# Patient Record
Sex: Male | Born: 1965 | Race: White | Hispanic: No | Marital: Single | Smoking: Never smoker
Health system: Southern US, Community
[De-identification: ages and names within clinical notes are randomized; demographics above are authoritative.]

## PROBLEM LIST (undated history)

## (undated) DIAGNOSIS — R011 Cardiac murmur, unspecified: Secondary | ICD-10-CM

## (undated) DIAGNOSIS — T7840XA Allergy, unspecified, initial encounter: Secondary | ICD-10-CM

## (undated) DIAGNOSIS — I10 Essential (primary) hypertension: Secondary | ICD-10-CM

## (undated) DIAGNOSIS — E785 Hyperlipidemia, unspecified: Secondary | ICD-10-CM

## (undated) HISTORY — PX: OTHER SURGICAL HISTORY: SHX169

## (undated) HISTORY — DX: Cardiac murmur, unspecified: R01.1

## (undated) HISTORY — DX: Allergy, unspecified, initial encounter: T78.40XA

## (undated) HISTORY — DX: Essential (primary) hypertension: I10

## (undated) HISTORY — DX: Hyperlipidemia, unspecified: E78.5

---

## 2000-05-03 ENCOUNTER — Encounter: Payer: Self-pay | Admitting: Internal Medicine

## 2000-05-03 ENCOUNTER — Ambulatory Visit (HOSPITAL_COMMUNITY): Admission: RE | Admit: 2000-05-03 | Discharge: 2000-05-03 | Payer: Self-pay | Admitting: Internal Medicine

## 2000-05-03 ENCOUNTER — Emergency Department (HOSPITAL_COMMUNITY): Admission: EM | Admit: 2000-05-03 | Discharge: 2000-05-03 | Payer: Self-pay | Admitting: Emergency Medicine

## 2000-06-24 ENCOUNTER — Encounter: Admission: RE | Admit: 2000-06-24 | Discharge: 2000-09-22 | Payer: Self-pay | Admitting: Specialist

## 2000-08-25 ENCOUNTER — Encounter: Payer: Self-pay | Admitting: *Deleted

## 2000-08-25 ENCOUNTER — Encounter: Admission: RE | Admit: 2000-08-25 | Discharge: 2000-08-25 | Payer: Self-pay | Admitting: *Deleted

## 2003-08-29 ENCOUNTER — Encounter: Payer: Self-pay | Admitting: Internal Medicine

## 2005-01-27 ENCOUNTER — Ambulatory Visit (HOSPITAL_COMMUNITY): Admission: RE | Admit: 2005-01-27 | Discharge: 2005-01-27 | Payer: Self-pay | Admitting: Internal Medicine

## 2005-01-27 ENCOUNTER — Ambulatory Visit: Payer: Self-pay | Admitting: Internal Medicine

## 2009-07-16 ENCOUNTER — Ambulatory Visit: Payer: Self-pay | Admitting: Internal Medicine

## 2009-07-16 DIAGNOSIS — I10 Essential (primary) hypertension: Secondary | ICD-10-CM | POA: Insufficient documentation

## 2009-07-17 ENCOUNTER — Encounter: Payer: Self-pay | Admitting: Internal Medicine

## 2009-07-17 LAB — CONVERTED CEMR LAB
ALT: 21 units/L (ref 0–53)
AST: 20 units/L (ref 0–37)
Albumin: 4 g/dL (ref 3.5–5.2)
Alkaline Phosphatase: 69 units/L (ref 39–117)
BUN: 13 mg/dL (ref 6–23)
Basophils Absolute: 0 10*3/uL (ref 0.0–0.1)
Basophils Relative: 0.8 % (ref 0.0–3.0)
Bilirubin Urine: NEGATIVE
Bilirubin, Direct: 0.2 mg/dL (ref 0.0–0.3)
CO2: 32 meq/L (ref 19–32)
Calcium: 9.4 mg/dL (ref 8.4–10.5)
Chloride: 101 meq/L (ref 96–112)
Cholesterol: 197 mg/dL (ref 0–200)
Creatinine, Ser: 1.1 mg/dL (ref 0.4–1.5)
Direct LDL: 109.4 mg/dL
Eosinophils Absolute: 0.2 10*3/uL (ref 0.0–0.7)
Eosinophils Relative: 3.3 % (ref 0.0–5.0)
GFR calc non Af Amer: 77.58 mL/min (ref >60)
Glucose, Bld: 98 mg/dL (ref 70–99)
HCT: 47.6 % (ref 39.0–52.0)
HDL: 41.7 mg/dL (ref >39.00)
Hemoglobin, Urine: NEGATIVE
Hemoglobin: 16 g/dL (ref 13.0–17.0)
Ketones, ur: NEGATIVE mg/dL
Leukocytes, UA: NEGATIVE
Lymphocytes Relative: 39.5 % (ref 12.0–46.0)
Lymphs Abs: 1.9 10*3/uL (ref 0.7–4.0)
MCHC: 33.7 g/dL (ref 30.0–36.0)
MCV: 92.2 fL (ref 78.0–100.0)
Monocytes Absolute: 0.4 10*3/uL (ref 0.1–1.0)
Monocytes Relative: 7.7 % (ref 3.0–12.0)
Neutro Abs: 2.3 10*3/uL (ref 1.4–7.7)
Neutrophils Relative %: 48.7 % (ref 43.0–77.0)
Nitrite: NEGATIVE
PSA: 0.91 ng/mL (ref 0.10–4.00)
Platelets: 159 10*3/uL (ref 150.0–400.0)
Potassium: 4.5 meq/L (ref 3.5–5.1)
RBC: 5.16 M/uL (ref 4.22–5.81)
RDW: 11.6 % (ref 11.5–14.6)
Sodium: 139 meq/L (ref 135–145)
Specific Gravity, Urine: 1.025 (ref 1.000–1.030)
TSH: 1.44 microintl units/mL (ref 0.35–5.50)
Total Bilirubin: 0.8 mg/dL (ref 0.3–1.2)
Total CHOL/HDL Ratio: 5
Total Protein, Urine: NEGATIVE mg/dL
Total Protein: 7.5 g/dL (ref 6.0–8.3)
Triglycerides: 350 mg/dL — ABNORMAL HIGH (ref 0.0–149.0)
Urine Glucose: NEGATIVE mg/dL
Urobilinogen, UA: 0.2 (ref 0.0–1.0)
VLDL: 70 mg/dL — ABNORMAL HIGH (ref 0.0–40.0)
WBC: 4.8 10*3/uL (ref 4.5–10.5)
pH: 6 (ref 5.0–8.0)

## 2009-07-25 ENCOUNTER — Encounter: Admission: RE | Admit: 2009-07-25 | Discharge: 2009-07-25 | Payer: Self-pay | Admitting: Internal Medicine

## 2009-07-25 ENCOUNTER — Telehealth: Payer: Self-pay | Admitting: Internal Medicine

## 2009-08-16 ENCOUNTER — Ambulatory Visit: Payer: Self-pay | Admitting: Internal Medicine

## 2010-07-01 NOTE — Medication Information (Signed)
Summary: Losartan Approved/MedImpact  Losartan Approved/MedImpact   Imported By: Sherian Rein 07/26/2009 07:35:42  _____________________________________________________________________  External Attachment:    Type:   Image     Comment:   External Document
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
## 2010-07-01 NOTE — Progress Notes (Signed)
Summary: Losartan PA approved  Phone Note From Pharmacy   Caller: Med[employer] Summary of Call: PA for Losartan Potassium was done and it approved for 12 fills until 07/21/2010. Initial call taken by: Lucious Groves,  July 25, 2009 8:48 AM

## 2010-07-01 NOTE — Progress Notes (Signed)
Summary: BP readings/Patient  BP readings/Patient   Imported By: Sherian Rein 07/20/2009 09:54:31  _____________________________________________________________________  External Attachment:    Type:   Image     Comment:   External Document

## 2010-07-01 NOTE — Medication Information (Signed)
Summary: [employer]   Imported By: Lester [place] 07/25/2009 09:41:36  _____________________________________________________________________  External Attachment:    Type:   Image     Comment:   External Document

## 2010-08-06 ENCOUNTER — Telehealth: Payer: Self-pay | Admitting: Internal Medicine

## 2010-08-12 NOTE — Progress Notes (Signed)
  Phone Note Refill Request Message from:  Fax from Pharmacy on August 06, 2010 9:26 AM  Refills Requested: Medication #1:  LOSARTAN POTASSIUM 50 MG TABS 1 po  once daily.   Dosage confirmed as above?Dosage Confirmed   Last Refilled: 07/2009   Notes: [facility] [place] Initial call taken by: Robin Ewing CMA (AAMA),  August 06, 2010 9:27 AM    Prescriptions: LOSARTAN POTASSIUM 50 MG TABS (LOSARTAN POTASSIUM) 1 po  once daily  #90 x 0   Entered by:   Scharlene Gloss CMA (AAMA)   Authorized by:   Corwin Levins MD   Signed by:   Scharlene Gloss CMA (AAMA) on 08/06/2010   Method used:   [facility]ed to ...       [facility] [address] [facility] [address] (retail)       [address] [place], [place]  [address]       Ph: [phone]       Fax: [phone]   RxID:   [id number]

## 2010-08-15 ENCOUNTER — Other Ambulatory Visit: Payer: PRIVATE HEALTH INSURANCE

## 2010-08-15 ENCOUNTER — Encounter: Payer: Self-pay | Admitting: Internal Medicine

## 2010-08-15 ENCOUNTER — Other Ambulatory Visit: Payer: Self-pay | Admitting: Internal Medicine

## 2010-08-15 ENCOUNTER — Ambulatory Visit (INDEPENDENT_AMBULATORY_CARE_PROVIDER_SITE_OTHER): Payer: PRIVATE HEALTH INSURANCE | Admitting: Internal Medicine

## 2010-08-15 DIAGNOSIS — Z0000 Encounter for general adult medical examination without abnormal findings: Secondary | ICD-10-CM

## 2010-08-15 DIAGNOSIS — M79609 Pain in unspecified limb: Secondary | ICD-10-CM | POA: Insufficient documentation

## 2010-08-15 DIAGNOSIS — R21 Rash and other nonspecific skin eruption: Secondary | ICD-10-CM | POA: Insufficient documentation

## 2010-08-15 LAB — LIPID PANEL: Cholesterol: 181 mg/dL (ref 0–200)

## 2010-08-15 LAB — HEPATIC FUNCTION PANEL
ALT: 27 U/L (ref 0–53)
AST: 22 U/L (ref 0–37)
Albumin: 4.4 g/dL (ref 3.5–5.2)
Total Protein: 7.7 g/dL (ref 6.0–8.3)

## 2010-08-15 LAB — CBC WITH DIFFERENTIAL/PLATELET
Basophils Relative: 0.7 % (ref 0.0–3.0)
Eosinophils Relative: 3.4 % (ref 0.0–5.0)
HCT: 45.1 % (ref 39.0–52.0)
Hemoglobin: 15.8 g/dL (ref 13.0–17.0)
Lymphs Abs: 2.3 10*3/uL (ref 0.7–4.0)
Monocytes Relative: 8.6 % (ref 3.0–12.0)
Neutro Abs: 2.3 10*3/uL (ref 1.4–7.7)
RBC: 4.95 Mil/uL (ref 4.22–5.81)
RDW: 12.1 % (ref 11.5–14.6)
WBC: 5.3 10*3/uL (ref 4.5–10.5)

## 2010-08-15 LAB — BASIC METABOLIC PANEL
BUN: 14 mg/dL (ref 6–23)
CO2: 30 mEq/L (ref 19–32)
Calcium: 8.8 mg/dL (ref 8.4–10.5)
Chloride: 102 mEq/L (ref 96–112)
Creatinine, Ser: 0.9 mg/dL (ref 0.4–1.5)
GFR: 92.54 mL/min (ref >60.00)
Glucose, Bld: 97 mg/dL (ref 70–99)
Potassium: 4.7 mEq/L (ref 3.5–5.1)
Sodium: 137 mEq/L (ref 135–145)

## 2010-08-15 LAB — URINALYSIS
Hgb urine dipstick: NEGATIVE
Leukocytes, UA: NEGATIVE
Nitrite: NEGATIVE
Total Protein, Urine: NEGATIVE
pH: 7 (ref 5.0–8.0)

## 2010-08-18 DIAGNOSIS — E785 Hyperlipidemia, unspecified: Secondary | ICD-10-CM

## 2010-08-18 LAB — LDL CHOLESTEROL, DIRECT: Direct LDL: 93.5 mg/dL
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
## 2010-12-22 ENCOUNTER — Encounter: Payer: Self-pay | Admitting: Internal Medicine

## 2010-12-22 ENCOUNTER — Ambulatory Visit (INDEPENDENT_AMBULATORY_CARE_PROVIDER_SITE_OTHER): Payer: PRIVATE HEALTH INSURANCE | Admitting: Internal Medicine

## 2010-12-22 ENCOUNTER — Ambulatory Visit (INDEPENDENT_AMBULATORY_CARE_PROVIDER_SITE_OTHER)
Admission: RE | Admit: 2010-12-22 | Discharge: 2010-12-22 | Disposition: A | Discharge status: Home / Self Care | Payer: PRIVATE HEALTH INSURANCE | Source: Ambulatory Visit | Attending: Internal Medicine | Admitting: Internal Medicine

## 2010-12-22 VITALS — BP 104/62 | HR 46 | Temp 97.9°F | Ht 74.0 in | Wt 205.0 lb

## 2010-12-22 DIAGNOSIS — M25539 Pain in unspecified wrist: Secondary | ICD-10-CM

## 2010-12-22 DIAGNOSIS — I10 Essential (primary) hypertension: Secondary | ICD-10-CM

## 2010-12-22 DIAGNOSIS — M25532 Pain in left wrist: Secondary | ICD-10-CM

## 2010-12-22 DIAGNOSIS — S62109A Fracture of unspecified carpal bone, unspecified wrist, initial encounter for closed fracture: Secondary | ICD-10-CM | POA: Insufficient documentation

## 2010-12-22 DIAGNOSIS — Z0001 Encounter for general adult medical examination with abnormal findings: Secondary | ICD-10-CM | POA: Insufficient documentation

## 2010-12-22 DIAGNOSIS — Z0000 Encounter for general adult medical examination without abnormal findings: Secondary | ICD-10-CM | POA: Insufficient documentation

## 2010-12-22 NOTE — Patient Instructions (Addendum)
Take all new medications as prescribed Continue all other medications as before Please go to XRAY in the Basement for the x-ray test Please call the phone number [phone] (the PhoneTree System) for results of testing in 2-3 days;  When calling, simply dial the number, and when prompted enter the MRN number above (the Medical Record Number) and the # key, then the message should start. You will be referred to orthopedic if there is a fracture Please return in March 2013 with Lab testing done 3-5 days before

## 2010-12-22 NOTE — Assessment & Plan Note (Signed)
stable overall by hx and exam, most recent data reviewed with pt, and pt to continue medical treatment as before  BP Readings from Last 3 Encounters:  12/22/10 104/62  08/15/10 104/70  08/16/09 102/64
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
## 2010-12-22 NOTE — Assessment & Plan Note (Signed)
Mod to severe swelling and pain, with bruising s/p fall with tennis x 3 days - for film today, I suspect fx, may need ortho referral

## 2011-09-14 ENCOUNTER — Other Ambulatory Visit: Payer: Self-pay

## 2011-09-14 MED ORDER — LOSARTAN POTASSIUM 50 MG PO TABS: 50.0000 mg | ORAL_TABLET | Freq: Every day | ORAL | Status: DC

## 2011-11-06 ENCOUNTER — Encounter: Payer: Self-pay | Admitting: Internal Medicine

## 2011-11-06 ENCOUNTER — Other Ambulatory Visit (INDEPENDENT_AMBULATORY_CARE_PROVIDER_SITE_OTHER): Payer: PRIVATE HEALTH INSURANCE

## 2011-11-06 ENCOUNTER — Telehealth: Payer: Self-pay

## 2011-11-06 ENCOUNTER — Ambulatory Visit (INDEPENDENT_AMBULATORY_CARE_PROVIDER_SITE_OTHER): Payer: PRIVATE HEALTH INSURANCE | Admitting: Internal Medicine

## 2011-11-06 VITALS — BP 102/62 | HR 45 | Temp 97.5°F | Ht 74.0 in | Wt 204.2 lb

## 2011-11-06 DIAGNOSIS — R21 Rash and other nonspecific skin eruption: Secondary | ICD-10-CM

## 2011-11-06 DIAGNOSIS — Z0000 Encounter for general adult medical examination without abnormal findings: Secondary | ICD-10-CM

## 2011-11-06 DIAGNOSIS — A64 Unspecified sexually transmitted disease: Secondary | ICD-10-CM

## 2011-11-06 LAB — TSH: TSH: 1.78 u[IU]/mL (ref 0.35–5.50)

## 2011-11-06 LAB — URINALYSIS, ROUTINE W REFLEX MICROSCOPIC
Bilirubin Urine: NEGATIVE
Hgb urine dipstick: NEGATIVE
Ketones, ur: NEGATIVE
Leukocytes, UA: NEGATIVE
Urobilinogen, UA: 0.2 (ref 0.0–1.0)

## 2011-11-06 LAB — BASIC METABOLIC PANEL
BUN: 15 mg/dL (ref 6–23)
Chloride: 105 mEq/L (ref 96–112)
GFR: 94.35 mL/min (ref >60.00)
Glucose, Bld: 86 mg/dL (ref 70–99)
Potassium: 4.6 mEq/L (ref 3.5–5.1)
Sodium: 139 mEq/L (ref 135–145)

## 2011-11-06 LAB — CBC WITH DIFFERENTIAL/PLATELET
Basophils Absolute: 0.1 10*3/uL (ref 0.0–0.1)
HCT: 44.4 % (ref 39.0–52.0)
Lymphs Abs: 1.5 10*3/uL (ref 0.7–4.0)
MCV: 91.9 fl (ref 78.0–100.0)
Monocytes Absolute: 0.3 10*3/uL (ref 0.1–1.0)
Monocytes Relative: 7.2 % (ref 3.0–12.0)
Neutrophils Relative %: 52.6 % (ref 43.0–77.0)
Platelets: 139 10*3/uL — ABNORMAL LOW (ref 150.0–400.0)
RDW: 12.8 % (ref 11.5–14.6)

## 2011-11-06 LAB — LIPID PANEL
Cholesterol: 162 mg/dL (ref 0–200)
VLDL: 34 mg/dL (ref 0.0–40.0)

## 2011-11-06 LAB — HEPATIC FUNCTION PANEL
ALT: 20 U/L (ref 0–53)
AST: 18 U/L (ref 0–37)
Albumin: 4 g/dL (ref 3.5–5.2)
Alkaline Phosphatase: 61 U/L (ref 39–117)

## 2011-11-06 MED ORDER — ASPIRIN 81 MG PO TBEC: 81.0000 mg | DELAYED_RELEASE_TABLET | Freq: Every day | ORAL | Status: AC

## 2011-11-06 MED ORDER — LOSARTAN POTASSIUM 50 MG PO TABS: 50.0000 mg | ORAL_TABLET | Freq: Every day | ORAL | Status: DC

## 2011-11-06 MED ORDER — IMIQUIMOD 5 % EX CREA: TOPICAL_CREAM | CUTANEOUS | Status: DC

## 2011-11-06 NOTE — Telephone Encounter (Signed)
There is no good alternative, ok for PA, if does not work out he will need referral to derm

## 2011-11-06 NOTE — Patient Instructions (Signed)
Take all new medications as prescribed Continue all other medications as before Please go to LAB in the Basement for the blood and/or urine tests to be done today You will be contacted by phone if any changes need to be made immediately.  Otherwise, you will receive a letter about your results with an explanation. Please start Aspirin 82 mg - 1 per day - COATED only (OTC) - to reduce risk of stroke and heart disease Please return in 1 year for your yearly visit, or sooner if needed, with Lab testing done 3-5 days before

## 2011-11-06 NOTE — Telephone Encounter (Signed)
Received PA for Imiquimod 5% cream packet offer alternative or proceed with PA

## 2011-11-07 ENCOUNTER — Encounter: Payer: Self-pay | Admitting: Internal Medicine

## 2011-11-07 LAB — HIV ANTIBODY (ROUTINE TESTING W REFLEX): HIV: NONREACTIVE

## 2011-11-07 LAB — GC/CHLAMYDIA PROBE AMP, URINE
Chlamydia, Swab/Urine, PCR: NEGATIVE
GC Probe Amp, Urine: NEGATIVE

## 2011-11-07 LAB — RPR
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
## 2011-11-07 NOTE — Assessment & Plan Note (Signed)

## 2011-11-07 NOTE — Assessment & Plan Note (Signed)
Ok for trial imiquomod asd, for STD labs, refer to derm if lesions not improved

## 2011-11-09 ENCOUNTER — Encounter: Payer: Self-pay | Admitting: Internal Medicine

## 2011-11-09 LAB — HEPATITIS PANEL, ACUTE
HCV Ab: NEGATIVE
Hepatitis B Surface Ag: NEGATIVE

## 2011-11-09 LAB — HSV 2 ANTIBODY, IGG: HSV 2 Glycoprotein G Ab, IgG: 0.13 IV

## 2011-11-12 NOTE — Telephone Encounter (Signed)
Faxed PA completed form to [phone] awaiting response.

## 2011-11-16 NOTE — Telephone Encounter (Signed)
Please inform pt , insurance will not pay for the tx we gave  I think he should still f/u with dermatology as we have referred

## 2011-11-16 NOTE — Telephone Encounter (Signed)
Received denial for PA for this medication.  Approval requires a trial of or a contraindication to podofilox. Please advise

## 2011-11-17 NOTE — Telephone Encounter (Signed)
Done per emr 

## 2011-11-17 NOTE — Telephone Encounter (Signed)
Called the patient informed PA denied.  Patient agreed to derm referral asap.

## 2012-06-06 ENCOUNTER — Ambulatory Visit (INDEPENDENT_AMBULATORY_CARE_PROVIDER_SITE_OTHER): Payer: PRIVATE HEALTH INSURANCE | Admitting: Internal Medicine

## 2012-06-06 ENCOUNTER — Encounter: Payer: Self-pay | Admitting: Internal Medicine

## 2012-06-06 VITALS — BP 132/86 | HR 47 | Temp 98.0°F

## 2012-06-06 DIAGNOSIS — H00019 Hordeolum externum unspecified eye, unspecified eyelid: Secondary | ICD-10-CM

## 2012-06-06 DIAGNOSIS — Z23 Encounter for immunization: Secondary | ICD-10-CM

## 2012-06-06 MED ORDER — ERYTHROMYCIN 5 MG/GM OP OINT: TOPICAL_OINTMENT | Freq: Every day | OPHTHALMIC | Status: DC

## 2012-06-06 NOTE — Progress Notes (Signed)
Subjective:    Patient ID: George Molina, male    DOB: 04-29-1966, 47 y.o.   MRN: [id number]  HPI  Pt presents to the clinic today with c/o a bump on his eyelid. It has been there for 1 week. It has not drained. It is tender. He has never had this before. He has not put anything on it. He denies fevers.  Review of Systems  Past Medical History  Diagnosis Date  . Hypertension     Current Outpatient Prescriptions  Medication Sig Dispense Refill  . aspirin 81 MG EC tablet Take 1 tablet (81 mg total) by mouth daily. Swallow whole.  30 tablet  12  . fluocinolone (SYNALAR) 0.025 % cream Apply topically daily as needed.        . imiquimod (ALDARA) 5 % cream Apply topically 3 (three) times a week.  12 each  0  . losartan (COZAAR) 50 MG tablet Take 1 tablet (50 mg total) by mouth daily.  90 tablet  3    No Known Allergies  Family History  Problem Relation Age of Onset  . Hypertension Mother   . Hypertension Brother   . Cancer Maternal Uncle     stomach cancer  . Cancer Maternal Grandfather     stomach cancer    History   Social History  . Marital Status: Married    Spouse Name: N/A    Number of Children: N/A  . Years of Education: N/A   Occupational History  . 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    Social History Main Topics  . Smoking status: Never Smoker   . Smokeless tobacco: Not on file  . Alcohol Use: Yes     Comment: rare  . Drug Use: No  . Sexually Active: Not on file   Other Topics Concern  . Not on file   Social History Narrative   [place] descent     Constitutional: Denies fever, malaise, fatigue, headache or abrupt weight changes.  HEENT: Denies eye pain, eye redness, ear pain, ringing in the ears, wax buildup, runny nose, nasal congestion, bloody nose, or sore throat. Skin: Pt reports a bump on his eyelid. Denies redness, rashes, lesions or ulcercations.  Neurological: Denies dizziness, difficulty with memory, difficulty with speech or  problems with balance and coordination.   No other specific complaints in a complete review of systems (except as listed in HPI above).     Objective:   Physical Exam   BP 132/86  Pulse 47  Temp 98 F (36.7 C) (Oral)  SpO2 97% Wt Readings from Last 3 Encounters:  11/06/11 204 lb 4 oz (92.647 kg)  12/22/10 205 lb (92.987 kg)  08/15/10 209 lb 2.1 oz (94.861 kg)    General: Appears his stated age, well developed, well nourished in NAD. Skin: Sty on right eye. Warm, dry and intact. No rashes, lesions or ulcerations noted. HEENT: Head: normal shape and size; Eyes: sclera white, no icterus, conjunctiva pink, PERRLA and EOMs intact; Ears: Tm's gray and intact, normal light reflex; Nose: mucosa pink and moist, septum midline; Throat/Mouth: Teeth present, mucosa pink and moist, no exudate, lesions or ulcerations noted.  Cardiovascular: Normal rate and rhythm. S1,S2 noted.  No murmur, rubs or gallops noted. No JVD or BLE edema. No carotid bruits noted. Pulmonary/Chest: Normal effort and positive vesicular breath sounds. No respiratory distress. No wheezes, rales or ronchi noted.       Assessment & Plan:   Sty, new onset with  additional workup required:  Warm compresses to the area 4 x per day Erythromycin eye ointment  RTC as needed or if symptoms persist

## 2012-06-06 NOTE — Patient Instructions (Addendum)
Sty  A sty (hordeolum) is an infection of a gland in the eyelid located at the base of the eyelash. A sty may develop a white or yellow head of pus. It can be puffy (swollen). Usually, the sty will burst and pus will come out on its own. They do not leave lumps in the eyelid once they drain.  A sty is often confused with another form of cyst of the eyelid called a chalazion. Chalazions occur within the eyelid and not on the edge where the bases of the eyelashes are. They often are red, sore and then form firm lumps in the eyelid.  CAUSES    Germs (bacteria).   Lasting (chronic) eyelid inflammation.  SYMPTOMS    Tenderness, redness and swelling along the edge of the eyelid at the base of the eyelashes.   Sometimes, there is a white or yellow head of pus. It may or may not drain.  DIAGNOSIS   An ophthalmologist will be able to distinguish between a sty and a chalazion and treat the condition appropriately.   TREATMENT    Styes are typically treated with warm packs (compresses) until drainage occurs.   In rare cases, medicines that kill germs (antibiotics) may be prescribed. These antibiotics may be in the form of drops, cream or pills.   If a hard lump has formed, it is generally necessary to do a small incision and remove the hardened contents of the cyst in a minor surgical procedure done in the office.   In suspicious cases, your caregiver may send the contents of the cyst to the lab to be certain that it is not a rare, but dangerous form of cancer of the glands of the eyelid.  HOME CARE INSTRUCTIONS    Wash your hands often and dry them with a clean towel. Avoid touching your eyelid. This may spread the infection to other parts of the eye.   Apply heat to your eyelid for 10 to 20 minutes, several times a day, to ease pain and help to heal it faster.   Do not squeeze the sty. Allow it to drain on its own. Wash your eyelid carefully 3 to 4 times per day to remove any pus.  SEEK IMMEDIATE MEDICAL CARE IF:     Your eye becomes painful or puffy (swollen).   Your vision changes.   Your sty does not drain by itself within 3 days.   Your sty comes back within a short period of time, even with treatment.   You have redness (inflammation) around the eye.   You have a fever.  Document Released: 02/25/2005 Document Revised: 08/10/2011 Document Reviewed: 10/30/2008  [employer] Patient Information 2013 [employer].

## 2012-12-08 ENCOUNTER — Other Ambulatory Visit: Payer: Self-pay | Admitting: Internal Medicine

## 2013-05-08 ENCOUNTER — Ambulatory Visit: Payer: PRIVATE HEALTH INSURANCE

## 2013-05-08 ENCOUNTER — Ambulatory Visit (INDEPENDENT_AMBULATORY_CARE_PROVIDER_SITE_OTHER): Payer: PRIVATE HEALTH INSURANCE | Admitting: Family Medicine

## 2013-05-08 VITALS — BP 138/72 | HR 49 | Temp 98.3°F | Resp 16 | Ht 74.5 in | Wt 207.0 lb

## 2013-05-08 DIAGNOSIS — S6991XA Unspecified injury of right wrist, hand and finger(s), initial encounter: Secondary | ICD-10-CM

## 2013-05-08 DIAGNOSIS — M79641 Pain in right hand: Secondary | ICD-10-CM

## 2013-05-08 DIAGNOSIS — M25441 Effusion, right hand: Secondary | ICD-10-CM

## 2013-05-08 DIAGNOSIS — M25449 Effusion, unspecified hand: Secondary | ICD-10-CM

## 2013-05-08 DIAGNOSIS — S6990XA Unspecified injury of unspecified wrist, hand and finger(s), initial encounter: Secondary | ICD-10-CM

## 2013-05-08 DIAGNOSIS — M79609 Pain in unspecified limb: Secondary | ICD-10-CM
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
## 2013-05-08 NOTE — Patient Instructions (Signed)
Your xrays do not show a fracture today, but I want to make sure we protect this area.  Wear the splint for the next 4 days.  Recheck with [place] on Thursday 12/11.  Continue icing and using ibuprofen for pain relief.  If any symptoms worsen or if you have concerns, please come back in sooner.     Hand Contusion A hand contusion is a deep bruise on your hand area. Contusions are the result of an injury that caused bleeding under the skin. The contusion may turn blue, purple, or yellow. Minor injuries will give you a painless contusion, but more severe contusions may stay painful and swollen for a few weeks. CAUSES  A contusion is usually caused by a blow, trauma, or direct force to an area of the body. SYMPTOMS   Swelling and redness of the injured area.  Discoloration of the injured area.  Tenderness and soreness of the injured area.  Pain. DIAGNOSIS  The diagnosis can be made by taking a history and performing a physical exam. An X-ray, CT scan, or MRI may be needed to determine if there were any associated injuries, such as broken bones (fractures). TREATMENT  Often, the best treatment for a hand contusion is resting, elevating, icing, and applying cold compresses to the injured area. Over-the-counter medicines may also be recommended for pain control. HOME CARE INSTRUCTIONS   Put ice on the injured area.  Put ice in a plastic bag.  Place a towel between your skin and the bag.  Leave the ice on for 15-20 minutes, 03-04 times a day.  Only take over-the-counter or prescription medicines as directed by your caregiver. Your caregiver may recommend avoiding anti-inflammatory medicines (aspirin, ibuprofen, and naproxen) for 48 hours because these medicines may increase bruising.  If told, use an elastic wrap as directed. This can help reduce swelling. You may remove the wrap for sleeping, showering, and bathing. If your fingers become numb, cold, or blue, take the wrap off and reapply it  more loosely.  Elevate your hand with pillows to reduce swelling.  Avoid overusing your hand if it is painful. SEEK IMMEDIATE MEDICAL CARE IF:   You have increased redness, swelling, or pain in your hand.  Your swelling or pain is not relieved with medicines.  You have loss of feeling in your hand or are unable to move your fingers.  Your hand turns cold or blue.  You have pain when you move your fingers.  Your hand becomes warm to the touch. MAKE SURE YOU:   Understand these instructions.  Will watch your condition.  Will get help right away if you are not doing well or get worse. Document Released: 11/07/2001 Document Revised: 02/10/2012 Document Reviewed: 11/09/2011 [facility] Patient Information 2014 [place], [place].

## 2013-05-11 ENCOUNTER — Ambulatory Visit (INDEPENDENT_AMBULATORY_CARE_PROVIDER_SITE_OTHER): Payer: PRIVATE HEALTH INSURANCE | Admitting: Internal Medicine

## 2013-05-11 VITALS — BP 122/70 | HR 51 | Temp 98.2°F | Resp 16 | Ht 72.5 in | Wt 210.0 lb

## 2013-05-11 DIAGNOSIS — S60221A Contusion of right hand, initial encounter: Secondary | ICD-10-CM

## 2013-05-11 DIAGNOSIS — S60229A Contusion of unspecified hand, initial encounter: Secondary | ICD-10-CM

## 2013-05-11 DIAGNOSIS — M79641 Pain in right hand: Secondary | ICD-10-CM

## 2013-05-11 DIAGNOSIS — M79609 Pain in unspecified limb: Secondary | ICD-10-CM

## 2013-05-11 NOTE — Progress Notes (Signed)
   Subjective:    Patient ID: George Molina, male    DOB: 03/01/1966, 47 y.o.   MRN: [id number]  HPI Patient is here for a follow up visit about his right hand. Ms Dollene Cleveland thought it could've been a FX that she couldn't see so wanted him to come back in to see how hand is doing. Patient states that its much better no swelling pain is minimal can now make a tight fist without it hurting. Patient states that it do hurt some if he squeezes something with that hand. This will be a week old injury Saturday. The radiologist didn't see a FX in his hand. Still have some small bruising on top of his hand     Review of Systems  Constitutional: Negative for activity change.  Skin: Negative for color change.  Neurological: Negative for weakness.       Objective:   Physical Exam  Constitutional: He is oriented to person, place, and time. He appears well-developed.  HENT:  Head: Normocephalic.  Eyes: EOM are normal.  Pulmonary/Chest: Effort normal.  Musculoskeletal: Normal range of motion. He exhibits edema and tenderness.  Neurological: He is alert and oriented to person, place, and time. No cranial nerve deficit. He exhibits normal muscle tone. Coordination normal.  Psychiatric: He has a normal mood and affect. His behavior is normal. Thought content normal.   Right hand swollen but healing, eccymosis resolving. NMVS intat, full rom       Assessment & Plan:  Rehab taught

## 2013-05-11 NOTE — Patient Instructions (Signed)
Hand Contusion °A hand contusion is a deep bruise on your hand area. Contusions are the result of an injury that caused bleeding under the skin. The contusion may turn blue, purple, or yellow. Minor injuries will give you a painless contusion, but more severe contusions may stay painful and swollen for a few weeks. °CAUSES  °A contusion is usually caused by a blow, trauma, or direct force to an area of the body. °SYMPTOMS  °· Swelling and redness of the injured area. °· Discoloration of the injured area. °· Tenderness and soreness of the injured area. °· Pain. °DIAGNOSIS  °The diagnosis can be made by taking a history and performing a physical exam. An X-ray, CT scan, or MRI may be needed to determine if there were any associated injuries, such as broken bones (fractures). °TREATMENT  °Often, the best treatment for a hand contusion is resting, elevating, icing, and applying cold compresses to the injured area. Over-the-counter medicines may also be recommended for pain control. °HOME CARE INSTRUCTIONS  °· Put ice on the injured area. °· Put ice in a plastic bag. °· Place a towel between your skin and the bag. °· Leave the ice on for 15-20 minutes, 03-04 times a day. °· Only take over-the-counter or prescription medicines as directed by your caregiver. Your caregiver may recommend avoiding anti-inflammatory medicines (aspirin, ibuprofen, and naproxen) for 48 hours because these medicines may increase bruising. °· If told, use an elastic wrap as directed. This can help reduce swelling. You may remove the wrap for sleeping, showering, and bathing. If your fingers become numb, cold, or blue, take the wrap off and reapply it more loosely. °· Elevate your hand with pillows to reduce swelling. °· Avoid overusing your hand if it is painful. °SEEK IMMEDIATE MEDICAL CARE IF:  °· You have increased redness, swelling, or pain in your hand. °· Your swelling or pain is not relieved with medicines. °· You have loss of feeling in  your hand or are unable to move your fingers. °· Your hand turns cold or blue. °· You have pain when you move your fingers. °· Your hand becomes warm to the touch. °· Your contusion does not improve in 2 days. °MAKE SURE YOU:  °· Understand these instructions. °· Will watch your condition. °· Will get help right away if you are not doing well or get worse. °Document Released: 11/07/2001 Document Revised: 02/10/2012 Document Reviewed: 11/09/2011 °[employer]® Patient Information ©2014 [employer]. ° °

## 2013-06-10 ENCOUNTER — Other Ambulatory Visit: Payer: Self-pay | Admitting: Internal Medicine

## 2013-08-28 ENCOUNTER — Other Ambulatory Visit: Payer: Self-pay | Admitting: Internal Medicine

## 2014-03-15 ENCOUNTER — Encounter: Payer: Self-pay | Admitting: Internal Medicine

## 2014-05-31 ENCOUNTER — Ambulatory Visit (INDEPENDENT_AMBULATORY_CARE_PROVIDER_SITE_OTHER): Payer: 59 | Admitting: Internal Medicine

## 2014-05-31 ENCOUNTER — Other Ambulatory Visit (INDEPENDENT_AMBULATORY_CARE_PROVIDER_SITE_OTHER): Payer: 59

## 2014-05-31 ENCOUNTER — Encounter: Payer: Self-pay | Admitting: Internal Medicine

## 2014-05-31 VITALS — BP 122/82 | HR 47 | Temp 98.0°F | Ht 74.0 in | Wt 216.4 lb

## 2014-05-31 DIAGNOSIS — Z0000 Encounter for general adult medical examination without abnormal findings: Secondary | ICD-10-CM

## 2014-05-31 DIAGNOSIS — I10 Essential (primary) hypertension: Secondary | ICD-10-CM

## 2014-05-31 LAB — URINALYSIS, ROUTINE W REFLEX MICROSCOPIC
BILIRUBIN URINE: NEGATIVE
HGB URINE DIPSTICK: NEGATIVE
Ketones, ur: NEGATIVE
Leukocytes, UA: NEGATIVE
NITRITE: NEGATIVE
RBC / HPF: NONE SEEN (ref 0–?)
Specific Gravity, Urine: 1.025 (ref 1.000–1.030)
Total Protein, Urine: NEGATIVE
Urine Glucose: NEGATIVE
Urobilinogen, UA: 0.2 (ref 0.0–1.0)
WBC, UA: NONE SEEN (ref 0–?)
pH: 6 (ref 5.0–8.0)

## 2014-05-31 LAB — LIPID PANEL
CHOL/HDL RATIO: 5
Cholesterol: 207 mg/dL — ABNORMAL HIGH (ref 0–200)
HDL: 41.1 mg/dL (ref >39.00)
NonHDL: 165.9
TRIGLYCERIDES: 224 mg/dL — AB (ref 0.0–149.0)
VLDL: 44.8 mg/dL — ABNORMAL HIGH (ref 0.0–40.0)

## 2014-05-31 LAB — HEPATIC FUNCTION PANEL
ALK PHOS: 62 U/L (ref 39–117)
ALT: 41 U/L (ref 0–53)
AST: 33 U/L (ref 0–37)
Albumin: 4.1 g/dL (ref 3.5–5.2)
Bilirubin, Direct: 0.2 mg/dL (ref 0.0–0.3)
TOTAL PROTEIN: 7.5 g/dL (ref 6.0–8.3)
Total Bilirubin: 1.6 mg/dL — ABNORMAL HIGH (ref 0.2–1.2)

## 2014-05-31 LAB — CBC WITH DIFFERENTIAL/PLATELET
BASOS ABS: 0.1 10*3/uL (ref 0.0–0.1)
Basophils Relative: 1.2 % (ref 0.0–3.0)
EOS ABS: 0.2 10*3/uL (ref 0.0–0.7)
Eosinophils Relative: 3.8 % (ref 0.0–5.0)
HCT: 46.8 % (ref 39.0–52.0)
Hemoglobin: 15.9 g/dL (ref 13.0–17.0)
LYMPHS PCT: 38.8 % (ref 12.0–46.0)
Lymphs Abs: 2.3 10*3/uL (ref 0.7–4.0)
MCHC: 34 g/dL (ref 30.0–36.0)
MCV: 90.1 fl (ref 78.0–100.0)
MONOS PCT: 8.5 % (ref 3.0–12.0)
Monocytes Absolute: 0.5 10*3/uL (ref 0.1–1.0)
NEUTROS ABS: 2.8 10*3/uL (ref 1.4–7.7)
NEUTROS PCT: 47.7 % (ref 43.0–77.0)
Platelets: 161 10*3/uL (ref 150.0–400.0)
RBC: 5.2 Mil/uL (ref 4.22–5.81)
RDW: 12.4 % (ref 11.5–15.5)
WBC: 5.8 10*3/uL (ref 4.0–10.5)

## 2014-05-31 LAB — BASIC METABOLIC PANEL
BUN: 17 mg/dL (ref 6–23)
CALCIUM: 8.9 mg/dL (ref 8.4–10.5)
CHLORIDE: 104 meq/L (ref 96–112)
CO2: 30 meq/L (ref 19–32)
Creatinine, Ser: 1.1 mg/dL (ref 0.4–1.5)
GFR: 78.38 mL/min (ref >60.00)
Glucose, Bld: 92 mg/dL (ref 70–99)
POTASSIUM: 4.4 meq/L (ref 3.5–5.1)
Sodium: 136 mEq/L (ref 135–145)

## 2014-05-31 LAB — TSH: TSH: 2.72 u[IU]/mL (ref 0.35–4.50)

## 2014-05-31 LAB — PSA: PSA: 1.13 ng/mL (ref 0.10–4.00)

## 2014-05-31 MED ORDER — LOSARTAN POTASSIUM 50 MG PO TABS: 50.0000 mg | ORAL_TABLET | Freq: Every day | ORAL | Status: DC
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
## 2014-05-31 NOTE — Progress Notes (Signed)
Pre visit review using our clinic review tool, if applicable. No additional management support is needed unless otherwise documented below in the visit note. 

## 2014-05-31 NOTE — Patient Instructions (Signed)
Please continue all other medications as before, and refills have been done if requested - the losartan  Please continue to monitor your Blood Pressures at home, with the goal being an average less than 140/90  Please have the pharmacy call with any other refills you may need.  Please continue your efforts at being more active, low cholesterol diet, and weight control.  You are otherwise up to date with prevention measures today.  Please keep your appointments with your specialists as you may have planned  Please go to the LAB in the Basement (turn left off the elevator) for the tests to be done today  You will be contacted by phone if any changes need to be made immediately.  Otherwise, you will receive a letter about your results with an explanation, but please check with MyChart first.  Please remember to sign up for MyChart if you have not done so, as this will be important to you in the future with finding out test results, communicating by private email, and scheduling acute appointments online when needed.  Please return in 1 year for your yearly visit, or sooner if needed, with Lab testing done 3-5 days before

## 2014-06-04 LAB — LDL CHOLESTEROL, DIRECT: LDL DIRECT: 121.5 mg/dL

## 2014-06-05 NOTE — Assessment & Plan Note (Signed)

## 2014-06-05 NOTE — Assessment & Plan Note (Signed)
stable overall by history and exam, recent data reviewed with pt, and pt to continue medical treatment as before,  to f/u any worsening symptoms or concerns, for losartan 50 refill

## 2015-06-04 ENCOUNTER — Encounter: Payer: 59 | Admitting: Internal Medicine

## 2015-12-10 ENCOUNTER — Encounter: Payer: Self-pay | Admitting: Internal Medicine

## 2015-12-31 ENCOUNTER — Ambulatory Visit (INDEPENDENT_AMBULATORY_CARE_PROVIDER_SITE_OTHER): Payer: PRIVATE HEALTH INSURANCE | Admitting: Internal Medicine

## 2015-12-31 ENCOUNTER — Other Ambulatory Visit (INDEPENDENT_AMBULATORY_CARE_PROVIDER_SITE_OTHER): Payer: PRIVATE HEALTH INSURANCE

## 2015-12-31 ENCOUNTER — Encounter: Payer: Self-pay | Admitting: Internal Medicine

## 2015-12-31 VITALS — BP 140/84 | HR 50 | Temp 98.4°F | Resp 20 | Wt 218.0 lb

## 2015-12-31 DIAGNOSIS — Z0001 Encounter for general adult medical examination with abnormal findings: Secondary | ICD-10-CM

## 2015-12-31 DIAGNOSIS — L989 Disorder of the skin and subcutaneous tissue, unspecified: Secondary | ICD-10-CM | POA: Diagnosis not present

## 2015-12-31 DIAGNOSIS — E041 Nontoxic single thyroid nodule: Secondary | ICD-10-CM

## 2015-12-31 DIAGNOSIS — Z23 Encounter for immunization: Secondary | ICD-10-CM

## 2015-12-31 DIAGNOSIS — R6889 Other general symptoms and signs: Secondary | ICD-10-CM

## 2015-12-31 DIAGNOSIS — E785 Hyperlipidemia, unspecified: Secondary | ICD-10-CM | POA: Diagnosis not present

## 2015-12-31 DIAGNOSIS — I10 Essential (primary) hypertension: Secondary | ICD-10-CM

## 2015-12-31 DIAGNOSIS — Z0000 Encounter for general adult medical examination without abnormal findings: Secondary | ICD-10-CM

## 2015-12-31 HISTORY — DX: Hyperlipidemia, unspecified: E78.5

## 2015-12-31 LAB — LIPID PANEL
CHOL/HDL RATIO: 4
Cholesterol: 170 mg/dL (ref 0–200)
HDL: 42.4 mg/dL (ref >39.00)
LDL Cholesterol: 100 mg/dL — ABNORMAL HIGH (ref 0–99)
NONHDL: 127.38
Triglycerides: 139 mg/dL (ref 0.0–149.0)
VLDL: 27.8 mg/dL (ref 0.0–40.0)

## 2015-12-31 LAB — BASIC METABOLIC PANEL
BUN: 15 mg/dL (ref 6–23)
CHLORIDE: 103 meq/L (ref 96–112)
CO2: 30 meq/L (ref 19–32)
Calcium: 9.1 mg/dL (ref 8.4–10.5)
Creatinine, Ser: 1.03 mg/dL (ref 0.40–1.50)
GFR: 81.36 mL/min (ref >60.00)
Glucose, Bld: 91 mg/dL (ref 70–99)
POTASSIUM: 4.6 meq/L (ref 3.5–5.1)
SODIUM: 138 meq/L (ref 135–145)

## 2015-12-31 LAB — URINALYSIS, ROUTINE W REFLEX MICROSCOPIC
BILIRUBIN URINE: NEGATIVE
Hgb urine dipstick: NEGATIVE
KETONES UR: NEGATIVE
Leukocytes, UA: NEGATIVE
Nitrite: NEGATIVE
PH: 7.5 (ref 5.0–8.0)
RBC / HPF: NONE SEEN (ref 0–?)
SPECIFIC GRAVITY, URINE: 1.015 (ref 1.000–1.030)
TOTAL PROTEIN, URINE-UPE24: NEGATIVE
UROBILINOGEN UA: 0.2 (ref 0.0–1.0)
Urine Glucose: NEGATIVE

## 2015-12-31 LAB — CBC WITH DIFFERENTIAL/PLATELET
BASOS ABS: 0 10*3/uL (ref 0.0–0.1)
BASOS PCT: 0.6 % (ref 0.0–3.0)
EOS ABS: 0.1 10*3/uL (ref 0.0–0.7)
Eosinophils Relative: 2.4 % (ref 0.0–5.0)
HCT: 45.3 % (ref 39.0–52.0)
Hemoglobin: 15.6 g/dL (ref 13.0–17.0)
LYMPHS ABS: 1.8 10*3/uL (ref 0.7–4.0)
Lymphocytes Relative: 29.4 % (ref 12.0–46.0)
MCHC: 34.3 g/dL (ref 30.0–36.0)
MCV: 88.8 fl (ref 78.0–100.0)
Monocytes Absolute: 0.4 10*3/uL (ref 0.1–1.0)
Monocytes Relative: 5.9 % (ref 3.0–12.0)
NEUTROS ABS: 3.8 10*3/uL (ref 1.4–7.7)
NEUTROS PCT: 61.7 % (ref 43.0–77.0)
PLATELETS: 161 10*3/uL (ref 150.0–400.0)
RBC: 5.11 Mil/uL (ref 4.22–5.81)
RDW: 12.4 % (ref 11.5–15.5)
WBC: 6.2 10*3/uL (ref 4.0–10.5)

## 2015-12-31 LAB — HEPATIC FUNCTION PANEL
ALK PHOS: 68 U/L (ref 39–117)
ALT: 23 U/L (ref 0–53)
AST: 16 U/L (ref 0–37)
Albumin: 4 g/dL (ref 3.5–5.2)
BILIRUBIN DIRECT: 0.2 mg/dL (ref 0.0–0.3)
BILIRUBIN TOTAL: 1.5 mg/dL — AB (ref 0.2–1.2)
TOTAL PROTEIN: 7.6 g/dL (ref 6.0–8.3)

## 2015-12-31 LAB — TSH: TSH: 1.65 u[IU]/mL (ref 0.35–4.50)

## 2015-12-31 LAB — PSA: PSA: 1.04 ng/mL (ref 0.10–4.00)

## 2015-12-31 NOTE — Progress Notes (Signed)
Pre visit review using our clinic review tool, if applicable. No additional management support is needed unless otherwise documented below in the visit note. 

## 2015-12-31 NOTE — Patient Instructions (Addendum)
You had the tetanus shot today  Please continue all other medications as before, and refills have been done if requested.  Please have the pharmacy call with any other refills you may need.  Please continue your efforts at being more active, low cholesterol diet, and weight control.  You are otherwise up to date with prevention measures today.  Please keep your appointments with your specialists as you may have planned  You will be contacted regarding the referral for: thyroid ultrasound  Please go to the LAB in the Basement (turn left off the elevator) for the tests to be done today  You will be contacted by phone if any changes need to be made immediately.  Otherwise, you will receive a letter about your results with an explanation, but please check with MyChart first.  Please remember to sign up for MyChart if you have not done so, as this will be important to you in the future with finding out test results, communicating by private email, and scheduling acute appointments online when needed.  Please return in 1 year for your yearly visit, or sooner if needed, with Lab testing done 3-5 days before
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
## 2016-01-05 DIAGNOSIS — L989 Disorder of the skin and subcutaneous tissue, unspecified: Secondary | ICD-10-CM | POA: Insufficient documentation

## 2016-01-05 NOTE — Assessment & Plan Note (Addendum)
Asympt, for thyroid u/s,  to f/u any worsening symptoms or concerns  In addition to the time spent performing CPE, I spent an additional 25 minutes face to face,in which greater than 50% of this time was spent in counseling and coordination of care for patient's acute illness as documented.

## 2016-01-05 NOTE — Assessment & Plan Note (Signed)
None suspicious appearing at this time, but has mutiple, likely should have full body exam per derm yearly, pt declines for now,  to f/u any worsening symptoms or concerns

## 2016-01-05 NOTE — Assessment & Plan Note (Signed)
stable overall by history and exam, recent data reviewed with pt, and pt to continue medical treatment as before,  to f/u any worsening symptoms or concerns BP Readings from Last 3 Encounters:  12/31/15 140/84  05/31/14 122/82  05/11/13 122/70

## 2016-01-05 NOTE — Assessment & Plan Note (Signed)
Goal ldl < 100, for f/u lab, lower chol diet,  to f/u any worsening symptoms or concerns

## 2016-01-05 NOTE — Assessment & Plan Note (Signed)

## 2016-01-10 ENCOUNTER — Ambulatory Visit
Admission: RE | Admit: 2016-01-10 | Discharge: 2016-01-10 | Disposition: A | Discharge status: Home / Self Care | Payer: PRIVATE HEALTH INSURANCE | Source: Ambulatory Visit | Attending: Internal Medicine | Admitting: Internal Medicine

## 2016-01-10 ENCOUNTER — Other Ambulatory Visit: Payer: Self-pay | Admitting: Internal Medicine

## 2016-01-10 DIAGNOSIS — E079 Disorder of thyroid, unspecified: Secondary | ICD-10-CM

## 2016-01-10 DIAGNOSIS — E041 Nontoxic single thyroid nodule: Secondary | ICD-10-CM

## 2016-01-14 ENCOUNTER — Telehealth: Payer: Self-pay | Admitting: Emergency Medicine

## 2016-01-14 NOTE — Telephone Encounter (Signed)
Called patient back, still unable to reach left message to return call.

## 2016-01-14 NOTE — Telephone Encounter (Signed)
Pt returned your call. Please call him back thanks.

## 2016-01-21 ENCOUNTER — Telehealth: Payer: Self-pay | Admitting: Internal Medicine

## 2016-01-21 NOTE — Telephone Encounter (Signed)
Endo referral was placed on 01/10/2016, but no response since. Patient called in to check up. Please expedite and contact him back. Thanks so much

## 2016-02-27 ENCOUNTER — Encounter: Payer: Self-pay | Admitting: Endocrinology

## 2016-02-27 ENCOUNTER — Ambulatory Visit (INDEPENDENT_AMBULATORY_CARE_PROVIDER_SITE_OTHER): Payer: PRIVATE HEALTH INSURANCE | Admitting: Endocrinology

## 2016-02-27 DIAGNOSIS — E069 Thyroiditis, unspecified: Secondary | ICD-10-CM
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
## 2016-02-27 NOTE — Patient Instructions (Addendum)
Please redo the thyroid ultrasound and blood test in 6 months. If these are good, all you need to do is your annual blood test and examination by Dr Jenny Reichmann. You have a high risk of abnormal thyroid blood tests in the future, so the annual checkups are important.  I would be happy to see you back here as needed.

## 2016-07-13 ENCOUNTER — Other Ambulatory Visit: Payer: PRIVATE HEALTH INSURANCE

## 2016-07-18 ENCOUNTER — Ambulatory Visit (HOSPITAL_COMMUNITY)
Admission: EM | Admit: 2016-07-18 | Discharge: 2016-07-18 | Disposition: A | Discharge status: Home / Self Care | Payer: PRIVATE HEALTH INSURANCE | Attending: Family Medicine | Admitting: Family Medicine

## 2016-07-18 ENCOUNTER — Encounter (HOSPITAL_COMMUNITY): Payer: Self-pay | Admitting: Family Medicine

## 2016-07-18 DIAGNOSIS — H6981 Other specified disorders of Eustachian tube, right ear: Secondary | ICD-10-CM

## 2016-07-18 MED ORDER — IPRATROPIUM BROMIDE 0.06 % NA SOLN: 2.0000 | Freq: Four times a day (QID) | NASAL | 0 refills | Status: DC

## 2016-07-18 MED ORDER — PSEUDOEPHEDRINE HCL 30 MG PO TABS: 30.0000 mg | ORAL_TABLET | ORAL | 0 refills | Status: DC | PRN
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
## 2016-07-18 NOTE — ED Triage Notes (Signed)
Pt here for right ear fullness x 3 weeks.

## 2016-08-13 ENCOUNTER — Ambulatory Visit (INDEPENDENT_AMBULATORY_CARE_PROVIDER_SITE_OTHER): Payer: PRIVATE HEALTH INSURANCE | Admitting: Internal Medicine

## 2016-08-13 ENCOUNTER — Encounter: Payer: Self-pay | Admitting: Internal Medicine

## 2016-08-13 VITALS — BP 118/78 | HR 46 | Temp 98.0°F | Wt 203.0 lb

## 2016-08-13 DIAGNOSIS — H6980 Other specified disorders of Eustachian tube, unspecified ear: Secondary | ICD-10-CM | POA: Insufficient documentation

## 2016-08-13 DIAGNOSIS — I10 Essential (primary) hypertension: Secondary | ICD-10-CM | POA: Diagnosis not present

## 2016-08-13 DIAGNOSIS — H6983 Other specified disorders of Eustachian tube, bilateral: Secondary | ICD-10-CM

## 2016-08-13 DIAGNOSIS — H9201 Otalgia, right ear: Secondary | ICD-10-CM | POA: Diagnosis not present

## 2016-08-13 MED ORDER — CETIRIZINE HCL 10 MG PO TABS: 10.0000 mg | ORAL_TABLET | Freq: Every day | ORAL | 11 refills | Status: DC

## 2016-08-13 MED ORDER — TRIAMCINOLONE ACETONIDE 55 MCG/ACT NA AERO: 2.0000 | INHALATION_SPRAY | Freq: Every day | NASAL | 12 refills | Status: DC

## 2016-08-13 MED ORDER — LEVOFLOXACIN 500 MG PO TABS: 500.0000 mg | ORAL_TABLET | Freq: Every day | ORAL | 0 refills | Status: AC

## 2016-08-13 NOTE — Patient Instructions (Signed)
Please take all new medication as prescribed - the antibiotic, zyrtec and nasacort as directed  You can also take Mucinex D twice per day (OTC)  OK to continue the Valsalva Maneuver you have been doing three times daily  Please call in 1 week if not improved for ENT referral  Please continue all other medications as before, and refills have been done if requested.  Please have the pharmacy call with any other refills you may need.  Please keep your appointments with your specialists as you may have planned
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
## 2016-08-13 NOTE — Assessment & Plan Note (Signed)
stable overall by history and exam, recent data reviewed with pt, and pt to continue medical treatment as before,  to f/u any worsening symptoms or concerns BP Readings from Last 3 Encounters:  08/13/16 118/78  07/18/16 153/88  02/27/16 136/82

## 2016-08-13 NOTE — Assessment & Plan Note (Signed)
Mild to mod, for antibx course trial,  to f/u any worsening symptoms or concerns

## 2016-08-13 NOTE — Assessment & Plan Note (Signed)
Persistent x 6 wks, for mucinex D bid, add Nasacort daily, and zyrtec 10 qd prn, antibx as above, and valvsalva three times daily, consider ENT if not improved in 1 wk

## 2016-08-13 NOTE — Progress Notes (Signed)
Pre visit review using our clinic review tool, if applicable. No additional management support is needed unless otherwise documented below in the visit note. 

## 2016-12-31 ENCOUNTER — Encounter: Payer: PRIVATE HEALTH INSURANCE | Admitting: Internal Medicine

## 2017-01-01 ENCOUNTER — Other Ambulatory Visit (INDEPENDENT_AMBULATORY_CARE_PROVIDER_SITE_OTHER): Payer: PRIVATE HEALTH INSURANCE

## 2017-01-01 ENCOUNTER — Ambulatory Visit (INDEPENDENT_AMBULATORY_CARE_PROVIDER_SITE_OTHER): Payer: PRIVATE HEALTH INSURANCE | Admitting: Internal Medicine

## 2017-01-01 ENCOUNTER — Encounter: Payer: Self-pay | Admitting: Internal Medicine

## 2017-01-01 VITALS — BP 144/94 | HR 48 | Ht 75.0 in | Wt 203.0 lb

## 2017-01-01 DIAGNOSIS — I10 Essential (primary) hypertension: Secondary | ICD-10-CM | POA: Diagnosis not present

## 2017-01-01 DIAGNOSIS — H6983 Other specified disorders of Eustachian tube, bilateral: Secondary | ICD-10-CM

## 2017-01-01 DIAGNOSIS — E785 Hyperlipidemia, unspecified: Secondary | ICD-10-CM

## 2017-01-01 DIAGNOSIS — E069 Thyroiditis, unspecified: Secondary | ICD-10-CM

## 2017-01-01 DIAGNOSIS — Z0001 Encounter for general adult medical examination with abnormal findings: Secondary | ICD-10-CM

## 2017-01-01 DIAGNOSIS — J309 Allergic rhinitis, unspecified: Secondary | ICD-10-CM

## 2017-01-01 LAB — LIPID PANEL
CHOL/HDL RATIO: 4
Cholesterol: 163 mg/dL (ref 0–200)
HDL: 46.6 mg/dL (ref >39.00)
LDL CALC: 92 mg/dL (ref 0–99)
NONHDL: 116.77
Triglycerides: 123 mg/dL (ref 0.0–149.0)
VLDL: 24.6 mg/dL (ref 0.0–40.0)

## 2017-01-01 LAB — BASIC METABOLIC PANEL
BUN: 16 mg/dL (ref 6–23)
CO2: 33 mEq/L — ABNORMAL HIGH (ref 19–32)
Calcium: 9.4 mg/dL (ref 8.4–10.5)
Chloride: 103 mEq/L (ref 96–112)
Creatinine, Ser: 1.03 mg/dL (ref 0.40–1.50)
GFR: 81.03 mL/min (ref >60.00)
Glucose, Bld: 79 mg/dL (ref 70–99)
POTASSIUM: 4.4 meq/L (ref 3.5–5.1)
SODIUM: 141 meq/L (ref 135–145)

## 2017-01-01 LAB — TSH: TSH: 2.1 u[IU]/mL (ref 0.35–4.50)

## 2017-01-01 LAB — URINALYSIS, ROUTINE W REFLEX MICROSCOPIC
BILIRUBIN URINE: NEGATIVE
Hgb urine dipstick: NEGATIVE
KETONES UR: NEGATIVE
Leukocytes, UA: NEGATIVE
NITRITE: NEGATIVE
PH: 6 (ref 5.0–8.0)
Specific Gravity, Urine: 1.025 (ref 1.000–1.030)
Total Protein, Urine: NEGATIVE
Urine Glucose: NEGATIVE
Urobilinogen, UA: 0.2 (ref 0.0–1.0)

## 2017-01-01 LAB — CBC WITH DIFFERENTIAL/PLATELET
BASOS PCT: 1.3 % (ref 0.0–3.0)
Basophils Absolute: 0.1 10*3/uL (ref 0.0–0.1)
EOS PCT: 3.2 % (ref 0.0–5.0)
Eosinophils Absolute: 0.1 10*3/uL (ref 0.0–0.7)
HEMATOCRIT: 46.3 % (ref 39.0–52.0)
HEMOGLOBIN: 15.6 g/dL (ref 13.0–17.0)
LYMPHS PCT: 34 % (ref 12.0–46.0)
Lymphs Abs: 1.6 10*3/uL (ref 0.7–4.0)
MCHC: 33.7 g/dL (ref 30.0–36.0)
MCV: 92 fl (ref 78.0–100.0)
Monocytes Absolute: 0.4 10*3/uL (ref 0.1–1.0)
Monocytes Relative: 7.9 % (ref 3.0–12.0)
Neutro Abs: 2.5 10*3/uL (ref 1.4–7.7)
Neutrophils Relative %: 53.6 % (ref 43.0–77.0)
Platelets: 155 10*3/uL (ref 150.0–400.0)
RBC: 5.03 Mil/uL (ref 4.22–5.81)
RDW: 12.7 % (ref 11.5–15.5)
WBC: 4.6 10*3/uL (ref 4.0–10.5)

## 2017-01-01 LAB — HEPATIC FUNCTION PANEL
ALT: 17 U/L (ref 0–53)
AST: 14 U/L (ref 0–37)
Albumin: 4.1 g/dL (ref 3.5–5.2)
Alkaline Phosphatase: 61 U/L (ref 39–117)
BILIRUBIN TOTAL: 1.3 mg/dL — AB (ref 0.2–1.2)
Bilirubin, Direct: 0.2 mg/dL (ref 0.0–0.3)
Total Protein: 7.3 g/dL (ref 6.0–8.3)

## 2017-01-01 LAB — T4, FREE: FREE T4: 1.22 ng/dL (ref 0.60–1.60)

## 2017-01-01 LAB — PSA: PSA: 1.07 ng/mL (ref 0.10–4.00)

## 2017-01-01 MED ORDER — ASPIRIN 81 MG PO TBEC: 81.0000 mg | DELAYED_RELEASE_TABLET | Freq: Every day | ORAL | 12 refills | Status: DC

## 2017-01-01 MED ORDER — LOSARTAN POTASSIUM 50 MG PO TABS: 50.0000 mg | ORAL_TABLET | Freq: Every day | ORAL | 3 refills | Status: DC

## 2017-01-01 MED ORDER — TRIAMCINOLONE ACETONIDE 55 MCG/ACT NA AERO: 2.0000 | INHALATION_SPRAY | Freq: Every day | NASAL | 12 refills | Status: DC

## 2017-01-01 MED ORDER — CETIRIZINE HCL 10 MG PO TABS: 10.0000 mg | ORAL_TABLET | Freq: Every day | ORAL | 11 refills | Status: DC
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
## 2017-01-01 NOTE — Patient Instructions (Addendum)
Please take all new medication as prescribed - the aspirin 81 mg - 1 per day, and losartan 50  Mg per day  Please take all new medication as prescribed  - the zyrtec and the nasacort  You can also take Mucinex (or it's generic off brand) for congestion, and tylenol as needed for pain.  Please continue all other medications as before, and refills have been done if requested.  Please have the pharmacy call with any other refills you may need.  Please continue your efforts at being more active, low cholesterol diet, and weight control.  You are otherwise up to date with prevention measures today.  You will be contacted regarding the referral for: colonoscopy  Please keep your appointments with your specialists as you may have planned  Please go to the LAB in the Basement (turn left off the elevator) for the tests to be done today  You will be contacted by phone if any changes need to be made immediately.  Otherwise, you will receive a letter about your results with an explanation, but please check with MyChart first.  Please remember to sign up for MyChart if you have not done so, as this will be important to you in the future with finding out test results, communicating by private email, and scheduling acute appointments online when needed.  Please return in 1 year for your yearly visit, or sooner if needed, with Lab testing done 3-5 days before

## 2017-01-03 NOTE — Assessment & Plan Note (Signed)
Mild to mod, for restart zyrtec and nasacort,  to f/u any worsening symptoms or concerns

## 2017-01-03 NOTE — Assessment & Plan Note (Signed)
Mild, to start asa and losartan asd,  to f/u any worsening symptoms or concerns

## 2017-01-03 NOTE — Assessment & Plan Note (Signed)
/  Mild, for mucinex otc prn,  to f/u any worsening symptoms or concerns 

## 2017-01-03 NOTE — Assessment & Plan Note (Addendum)
For free t4 and tsh, f/u Endo as planned  In addition to the time spent performing CPE, I spent an additional 15 minutes face to face,in which greater than 50% of this time was spent in counseling and coordination of care for patient's illness as documented, including the differential dx, and further evaluation, tx and management overall of thyroiditis, new onset HTN, uncontrolled allergies, eustachian valve dysfxn, and HLD

## 2017-01-03 NOTE — Assessment & Plan Note (Signed)
Lab Results  Component Value Date   LDLCALC 92 01/01/2017  stable overall by history and exam, recent data reviewed with pt, and pt to continue medical treatment as before,  to f/u any worsening symptoms or concerns

## 2017-01-18 ENCOUNTER — Encounter: Payer: Self-pay | Admitting: Gastroenterology

## 2017-03-12 ENCOUNTER — Ambulatory Visit (AMBULATORY_SURGERY_CENTER): Payer: Self-pay

## 2017-03-12 VITALS — Ht 74.0 in | Wt 206.6 lb

## 2017-03-12 DIAGNOSIS — Z1211 Encounter for screening for malignant neoplasm of colon: Secondary | ICD-10-CM

## 2017-03-12 MED ORDER — SUPREP BOWEL PREP KIT 17.5-3.13-1.6 GM/177ML PO SOLN: 1.0000 | Freq: Once | ORAL | 0 refills | Status: AC

## 2017-03-12 NOTE — Progress Notes (Signed)
No home oxygen use No diet meds No allergies to eggs or soy No past exposure to anesthesia  Registered emmi

## 2017-03-15 ENCOUNTER — Encounter: Payer: Self-pay | Admitting: Gastroenterology

## 2017-03-26 ENCOUNTER — Encounter: Payer: Self-pay | Admitting: Gastroenterology

## 2017-03-26 ENCOUNTER — Ambulatory Visit (AMBULATORY_SURGERY_CENTER): Payer: PRIVATE HEALTH INSURANCE | Admitting: Gastroenterology

## 2017-03-26 VITALS — BP 120/75 | HR 45 | Temp 97.5°F | Resp 10 | Ht 74.0 in | Wt 206.0 lb

## 2017-03-26 DIAGNOSIS — Z1212 Encounter for screening for malignant neoplasm of rectum: Secondary | ICD-10-CM

## 2017-03-26 DIAGNOSIS — D120 Benign neoplasm of cecum: Secondary | ICD-10-CM | POA: Diagnosis not present

## 2017-03-26 DIAGNOSIS — Z1211 Encounter for screening for malignant neoplasm of colon: Secondary | ICD-10-CM | POA: Diagnosis present

## 2017-03-26 MED ORDER — SODIUM CHLORIDE 0.9 % IV SOLN: 500.0000 mL | INTRAVENOUS | Status: DC

## 2017-03-26 NOTE — Progress Notes (Signed)
To recovery, report to RN, VSS. 

## 2017-03-26 NOTE — Patient Instructions (Signed)
Handout given on polyps  YOU HAD AN ENDOSCOPIC PROCEDURE TODAY AT THE [facility]:   Refer to the procedure report that was given to you for any specific questions about what was found during the examination.  If the procedure report does not answer your questions, please call your gastroenterologist to clarify.  If you requested that your care partner not be given the details of your procedure findings, then the procedure report has been included in a sealed envelope for you to review at your convenience later.  YOU SHOULD EXPECT: Some feelings of bloating in the abdomen. Passage of more gas than usual.  Walking can help get rid of the air that was put into your GI tract during the procedure and reduce the bloating. If you had a lower endoscopy (such as a colonoscopy or flexible sigmoidoscopy) you may notice spotting of blood in your stool or on the toilet paper. If you underwent a bowel prep for your procedure, you may not have a normal bowel movement for a few days.  Please Note:  You might notice some irritation and congestion in your nose or some drainage.  This is from the oxygen used during your procedure.  There is no need for concern and it should clear up in a day or so.  SYMPTOMS TO REPORT IMMEDIATELY:   Following lower endoscopy (colonoscopy or flexible sigmoidoscopy):  Excessive amounts of blood in the stool  Significant tenderness or worsening of abdominal pains  Swelling of the abdomen that is new, acute  Fever of 100F or higher    For urgent or emergent issues, a gastroenterologist can be reached at any hour by calling [phone].   DIET:  We do recommend a small meal at first, but then you may proceed to your regular diet.  Drink plenty of fluids but you should avoid alcoholic beverages for 24 hours.  ACTIVITY:  You should plan to take it easy for the rest of today and you should NOT DRIVE or use heavy machinery until tomorrow (because of the sedation  medicines used during the test).    FOLLOW UP: Our staff will call the number listed on your records the next business day following your procedure to check on you and address any questions or concerns that you may have regarding the information given to you following your procedure. If we do not reach you, we will leave a message.  However, if you are feeling well and you are not experiencing any problems, there is no need to return our call.  We will assume that you have returned to your regular daily activities without incident.  If any biopsies were taken you will be contacted by phone or by letter within the next 1-3 weeks.  Please call us at [phone] if you have not heard about the biopsies in 3 weeks.    SIGNATURES/CONFIDENTIALITY: You and/or your care partner have signed paperwork which will be entered into your electronic medical record.  These signatures attest to the fact that that the information above on your After Visit Summary has been reviewed and is understood.  Full responsibility of the confidentiality of this discharge information lies with you and/or your care-partner. 

## 2017-03-26 NOTE — Progress Notes (Signed)
FOLLOW DISCHARGE INSTRUCTIONS (BLUE & GREEN SHEETS).    

## 2017-03-26 NOTE — Op Note (Signed)
[facility] Patient Name: George Molina Procedure Date: 03/26/2017 11:14 AM MRN: [id number] Endoscopist: Mallie Mussel L. [name] , MD Age: 51 Referring MD:  Date of Birth: [birth year] Gender: Male Account #: [id number] Procedure:                Colonoscopy Indications:              Screening for colorectal malignant neoplasm, This                            is the patient's first colonoscopy Medicines:                Monitored Anesthesia Care Procedure:                Pre-Anesthesia Assessment:                           - Prior to the procedure, a History and Physical                            was performed, and patient medications and                            allergies were reviewed. The patient's tolerance of                            previous anesthesia was also reviewed. The risks                            and benefits of the procedure and the sedation                            options and risks were discussed with the patient.                            All questions were answered, and informed consent                            was obtained. Anticoagulants: The patient has taken                            aspirin. It was decided not to withhold this                            medication prior to the procedure. ASA Grade                            Assessment: II - A patient with mild systemic                            disease. After reviewing the risks and benefits,                            the patient was deemed in satisfactory condition to  undergo the procedure.                           After obtaining informed consent, the colonoscope                            was passed under direct vision. Throughout the                            procedure, the patient's blood pressure, pulse, and                            oxygen saturations were monitored continuously. The                            Colonoscope was introduced through the anus  and                            advanced to the the terminal ileum. The colonoscopy                            was performed without difficulty. The patient                            tolerated the procedure well. The quality of the                            bowel preparation was excellent. The ileocecal                            valve, appendiceal orifice, and rectum were                            photographed. The quality of the bowel preparation                            was evaluated using the BBPS [facility] Bowel                            Preparation Scale) with scores of: Right Colon = 3,                            Transverse Colon = 3 and Left Colon = 3 (entire                            mucosa seen well with no residual staining, small                            fragments of stool or opaque liquid). The total                            BBPS score equals 9. The bowel preparation used was  SUPREP. Scope In: 11:25:54 AM Scope Out: 11:38:30 AM Scope Withdrawal Time: 0 hours 8 minutes 33 seconds  Total Procedure Duration: 0 hours 12 minutes 36 seconds  Findings:                 The perianal and digital rectal examinations were                            normal.                           A 2 mm polyp was found in the cecum. The polyp was                            sessile. The polyp was removed with a cold biopsy                            forceps. Resection and retrieval were complete.                           Internal hemorrhoids were found. The hemorrhoids                            were Grade I (internal hemorrhoids that do not                            prolapse).                           The exam was otherwise without abnormality on                            direct and retroflexion views. Complications:            No immediate complications. Estimated Blood Loss:     Estimated blood loss: none. Impression:               - One 2 mm polyp in the  cecum, removed with a cold                            biopsy forceps. Resected and retrieved.                           - Internal hemorrhoids.                           - The examination was otherwise normal on direct                            and retroflexion views. Recommendation:           - Patient has a contact number available for                            emergencies. The signs and symptoms of potential  delayed complications were discussed with the                            patient. Return to normal activities tomorrow.                            Written discharge instructions were provided to the                            patient.                           - Resume previous diet.                           - Continue present medications.                           - Await pathology results.                           - Repeat colonoscopy is recommended for                            surveillance. The colonoscopy date will be                            determined after pathology results from today's                            exam become available for review. Henry L. [name], MD 03/26/2017 11:41:48 AM This report has been signed electronically.

## 2017-03-26 NOTE — Progress Notes (Signed)
Called to room to assist during endoscopic procedure.  Patient ID and intended procedure confirmed with present staff. Received instructions for my participation in the procedure from the performing physician.  

## 2017-03-29 ENCOUNTER — Telehealth: Payer: Self-pay | Admitting: *Deleted

## 2017-03-29 NOTE — Telephone Encounter (Signed)
  Follow up Call-  Call back number 03/26/2017  Post procedure Call Back phone  # [phone]  Permission to leave phone message Yes  Some recent data might be hidden     Patient questions:  Do you have a fever, pain , or abdominal swelling? No. Pain Score  0 *  Have you tolerated food without any problems? Yes.    Have you been able to return to your normal activities? Yes.    Do you have any questions about your discharge instructions: Diet   No. Medications  No. Follow up visit  No.  Do you have questions or concerns about your Care? No.  Actions: * If pain score is 4 or above: No action needed, pain <4.

## 2017-03-31 ENCOUNTER — Encounter: Payer: Self-pay | Admitting: Gastroenterology

## 2017-06-03 ENCOUNTER — Telehealth: Payer: Self-pay

## 2017-06-03 DIAGNOSIS — G8929 Other chronic pain: Secondary | ICD-10-CM

## 2017-06-03 DIAGNOSIS — M545 Low back pain: Principal | ICD-10-CM

## 2017-06-03 NOTE — Telephone Encounter (Signed)
Referral done

## 2017-06-03 NOTE — Addendum Note (Signed)
Addended by: Biagio Borg on: 06/03/2017 04:49 PM   Modules accepted: Orders

## 2017-06-03 NOTE — Telephone Encounter (Signed)
Copied from [place] [id number]. Topic: Referral - Request >> Jun 03, 2017  4:05 PM Molina, George [place] wrote: Reason for CRM: Pt would like a referral to [facility] for his lower back inj.  [address], [place], [place] [address] [phone]  Please f/u with pt

## 2017-06-09 ENCOUNTER — Ambulatory Visit: Payer: PRIVATE HEALTH INSURANCE | Admitting: Internal Medicine

## 2017-06-09 ENCOUNTER — Encounter: Payer: Self-pay | Admitting: Internal Medicine

## 2017-06-09 VITALS — BP 124/86 | HR 49 | Temp 98.4°F | Ht 74.0 in | Wt 210.0 lb

## 2017-06-09 DIAGNOSIS — I10 Essential (primary) hypertension: Secondary | ICD-10-CM

## 2017-06-09 DIAGNOSIS — M5442 Lumbago with sciatica, left side: Secondary | ICD-10-CM | POA: Diagnosis not present

## 2017-06-09 DIAGNOSIS — M5432 Sciatica, left side: Secondary | ICD-10-CM

## 2017-06-09 MED ORDER — CYCLOBENZAPRINE HCL 5 MG PO TABS: 5.0000 mg | ORAL_TABLET | Freq: Three times a day (TID) | ORAL | 1 refills | Status: DC | PRN

## 2017-06-09 MED ORDER — PREDNISONE 10 MG PO TABS: ORAL_TABLET | ORAL | 0 refills | Status: DC
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
## 2017-06-09 NOTE — Patient Instructions (Addendum)
Please take all new medication as prescribed - the prednisone, and muscle relaxer as needed  Please continue all other medications as before, and refills have been done if requested.  Please have the pharmacy call with any other refills you may need.  Please keep your appointments with your specialists as you may have planned  You will be contacted regarding the referral for: Physical Therapy

## 2017-06-10 DIAGNOSIS — M545 Low back pain, unspecified: Secondary | ICD-10-CM | POA: Insufficient documentation

## 2017-06-10 DIAGNOSIS — M5432 Sciatica, left side: Secondary | ICD-10-CM | POA: Insufficient documentation

## 2017-06-10 NOTE — Assessment & Plan Note (Signed)
stable overall by history and exam, recent data reviewed with pt, and pt to continue medical treatment as before,  to f/u any worsening symptoms or concerns BP Readings from Last 3 Encounters:  06/09/17 124/86  03/26/17 120/75  01/01/17 (!) 144/94

## 2017-06-10 NOTE — Assessment & Plan Note (Signed)
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

## 2017-06-10 NOTE — Assessment & Plan Note (Signed)
/  Mild to mod, for predpac asd,  to f/u any worsening symptoms or concerns 

## 2017-07-01 DIAGNOSIS — M5136 Other intervertebral disc degeneration, lumbar region: Secondary | ICD-10-CM | POA: Insufficient documentation

## 2017-07-01 DIAGNOSIS — M51369 Other intervertebral disc degeneration, lumbar region without mention of lumbar back pain or lower extremity pain: Secondary | ICD-10-CM | POA: Insufficient documentation

## 2017-09-14 ENCOUNTER — Telehealth: Payer: Self-pay | Admitting: Internal Medicine

## 2017-09-14 DIAGNOSIS — Z021 Encounter for pre-employment examination: Secondary | ICD-10-CM

## 2017-09-14 NOTE — Telephone Encounter (Signed)
Copied from [place]. Topic: Quick Communication - See Telephone Encounter >> Sep 14, 2017  4:40 PM George Molina, [place] wrote: CRM for notification. See Telephone encounter for: 09/14/17.  Pt. Calling ask about immuzation records [phone]

## 2017-09-15 NOTE — Telephone Encounter (Signed)
See below

## 2017-09-15 NOTE — Telephone Encounter (Signed)
Called pt, left msg with details below

## 2017-09-15 NOTE — Telephone Encounter (Signed)
Pt would like to know if he could start the hep b series. He has started a new job. Also if he has had them before and cant remember would it cause any health issues to start them again? Please advise.

## 2017-09-15 NOTE — Telephone Encounter (Signed)
Ok for hep B IgG blood test - (ordered)  If positive for antibody, then he would not need Hep B series (though it would be unlikely to hurt anything if he did)

## 2018-01-04 ENCOUNTER — Encounter: Payer: PRIVATE HEALTH INSURANCE | Admitting: Internal Medicine

## 2018-01-21 ENCOUNTER — Other Ambulatory Visit: Payer: Self-pay

## 2018-01-21 ENCOUNTER — Other Ambulatory Visit (INDEPENDENT_AMBULATORY_CARE_PROVIDER_SITE_OTHER): Payer: 59

## 2018-01-21 ENCOUNTER — Ambulatory Visit (INDEPENDENT_AMBULATORY_CARE_PROVIDER_SITE_OTHER): Payer: 59 | Admitting: Internal Medicine

## 2018-01-21 ENCOUNTER — Encounter: Payer: Self-pay | Admitting: Internal Medicine

## 2018-01-21 VITALS — BP 128/80 | HR 48 | Temp 98.3°F | Ht 74.0 in | Wt 210.2 lb

## 2018-01-21 DIAGNOSIS — E069 Thyroiditis, unspecified: Secondary | ICD-10-CM | POA: Diagnosis not present

## 2018-01-21 DIAGNOSIS — Z0000 Encounter for general adult medical examination without abnormal findings: Secondary | ICD-10-CM | POA: Diagnosis not present

## 2018-01-21 LAB — CBC WITH DIFFERENTIAL/PLATELET
BASOS PCT: 1.2 % (ref 0.0–3.0)
Basophils Absolute: 0 10*3/uL (ref 0.0–0.1)
EOS PCT: 3.3 % (ref 0.0–5.0)
Eosinophils Absolute: 0.1 10*3/uL (ref 0.0–0.7)
HCT: 47.7 % (ref 39.0–52.0)
Hemoglobin: 16.4 g/dL (ref 13.0–17.0)
LYMPHS ABS: 1.5 10*3/uL (ref 0.7–4.0)
Lymphocytes Relative: 34.6 % (ref 12.0–46.0)
MCHC: 34.3 g/dL (ref 30.0–36.0)
MCV: 90.5 fl (ref 78.0–100.0)
MONO ABS: 0.3 10*3/uL (ref 0.1–1.0)
MONOS PCT: 8 % (ref 3.0–12.0)
NEUTROS ABS: 2.2 10*3/uL (ref 1.4–7.7)
NEUTROS PCT: 52.9 % (ref 43.0–77.0)
Platelets: 153 10*3/uL (ref 150.0–400.0)
RBC: 5.27 Mil/uL (ref 4.22–5.81)
RDW: 12.4 % (ref 11.5–15.5)
WBC: 4.2 10*3/uL (ref 4.0–10.5)

## 2018-01-21 LAB — BASIC METABOLIC PANEL
BUN: 15 mg/dL (ref 6–23)
CALCIUM: 9.7 mg/dL (ref 8.4–10.5)
CO2: 31 meq/L (ref 19–32)
Chloride: 101 mEq/L (ref 96–112)
Creatinine, Ser: 1.04 mg/dL (ref 0.40–1.50)
GFR: 79.8 mL/min (ref >60.00)
GLUCOSE: 83 mg/dL (ref 70–99)
POTASSIUM: 4.3 meq/L (ref 3.5–5.1)
SODIUM: 137 meq/L (ref 135–145)

## 2018-01-21 LAB — URINALYSIS, ROUTINE W REFLEX MICROSCOPIC
Bilirubin Urine: NEGATIVE
Hgb urine dipstick: NEGATIVE
KETONES UR: NEGATIVE
LEUKOCYTES UA: NEGATIVE
Nitrite: NEGATIVE
PH: 6 (ref 5.0–8.0)
SPECIFIC GRAVITY, URINE: 1.025 (ref 1.000–1.030)
TOTAL PROTEIN, URINE-UPE24: NEGATIVE
URINE GLUCOSE: NEGATIVE
Urobilinogen, UA: 0.2 (ref 0.0–1.0)
WBC, UA: NONE SEEN — AB (ref 0–?)

## 2018-01-21 LAB — TSH: TSH: 2.01 u[IU]/mL (ref 0.35–4.50)

## 2018-01-21 LAB — LIPID PANEL
CHOL/HDL RATIO: 4
Cholesterol: 163 mg/dL (ref 0–200)
HDL: 45 mg/dL (ref >39.00)
LDL Cholesterol: 82 mg/dL (ref 0–99)
NonHDL: 117.51
TRIGLYCERIDES: 176 mg/dL — AB (ref 0.0–149.0)
VLDL: 35.2 mg/dL (ref 0.0–40.0)

## 2018-01-21 LAB — T4, FREE: Free T4: 0.82 ng/dL (ref 0.60–1.60)

## 2018-01-21 LAB — HEPATIC FUNCTION PANEL
ALBUMIN: 4.2 g/dL (ref 3.5–5.2)
ALT: 15 U/L (ref 0–53)
AST: 15 U/L (ref 0–37)
Alkaline Phosphatase: 69 U/L (ref 39–117)
BILIRUBIN TOTAL: 1.5 mg/dL — AB (ref 0.2–1.2)
Bilirubin, Direct: 0.2 mg/dL (ref 0.0–0.3)
Total Protein: 7.7 g/dL (ref 6.0–8.3)

## 2018-01-21 LAB — T3, FREE: T3, Free: 3.5 pg/mL (ref 2.3–4.2)

## 2018-01-21 MED ORDER — LOSARTAN POTASSIUM 50 MG PO TABS: 50.0000 mg | ORAL_TABLET | Freq: Every day | ORAL | 3 refills | Status: DC

## 2018-01-21 NOTE — Assessment & Plan Note (Signed)

## 2018-01-21 NOTE — Patient Instructions (Signed)
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
## 2018-01-21 NOTE — Assessment & Plan Note (Signed)
[place] for f/u TFT'

## 2019-04-03 ENCOUNTER — Encounter: Payer: Self-pay | Admitting: Internal Medicine

## 2019-04-03 ENCOUNTER — Other Ambulatory Visit (INDEPENDENT_AMBULATORY_CARE_PROVIDER_SITE_OTHER): Payer: 59

## 2019-04-03 ENCOUNTER — Other Ambulatory Visit: Payer: Self-pay

## 2019-04-03 ENCOUNTER — Other Ambulatory Visit: Payer: Self-pay | Admitting: Internal Medicine

## 2019-04-03 ENCOUNTER — Ambulatory Visit (INDEPENDENT_AMBULATORY_CARE_PROVIDER_SITE_OTHER): Payer: 59 | Admitting: Internal Medicine

## 2019-04-03 VITALS — BP 130/90 | HR 48 | Temp 98.7°F | Ht 74.0 in | Wt 210.0 lb

## 2019-04-03 DIAGNOSIS — E559 Vitamin D deficiency, unspecified: Secondary | ICD-10-CM

## 2019-04-03 DIAGNOSIS — Z205 Contact with and (suspected) exposure to viral hepatitis: Secondary | ICD-10-CM

## 2019-04-03 DIAGNOSIS — Z125 Encounter for screening for malignant neoplasm of prostate: Secondary | ICD-10-CM | POA: Diagnosis not present

## 2019-04-03 DIAGNOSIS — Z23 Encounter for immunization: Secondary | ICD-10-CM

## 2019-04-03 DIAGNOSIS — Z0000 Encounter for general adult medical examination without abnormal findings: Secondary | ICD-10-CM

## 2019-04-03 DIAGNOSIS — E538 Deficiency of other specified B group vitamins: Secondary | ICD-10-CM

## 2019-04-03 DIAGNOSIS — I10 Essential (primary) hypertension: Secondary | ICD-10-CM | POA: Diagnosis not present

## 2019-04-03 DIAGNOSIS — E785 Hyperlipidemia, unspecified: Secondary | ICD-10-CM | POA: Diagnosis not present

## 2019-04-03 DIAGNOSIS — R739 Hyperglycemia, unspecified: Secondary | ICD-10-CM

## 2019-04-03 LAB — BASIC METABOLIC PANEL
BUN: 19 mg/dL (ref 6–23)
CO2: 32 mEq/L (ref 19–32)
Calcium: 9.3 mg/dL (ref 8.4–10.5)
Chloride: 100 mEq/L (ref 96–112)
Creatinine, Ser: 0.94 mg/dL (ref 0.40–1.50)
GFR: 83.98 mL/min (ref >60.00)
Glucose, Bld: 113 mg/dL — ABNORMAL HIGH (ref 70–99)
Potassium: 4.5 mEq/L (ref 3.5–5.1)
Sodium: 138 mEq/L (ref 135–145)

## 2019-04-03 LAB — LIPID PANEL
Cholesterol: 201 mg/dL — ABNORMAL HIGH (ref 0–200)
HDL: 43.1 mg/dL (ref >39.00)
NonHDL: 157.86
Total CHOL/HDL Ratio: 5
Triglycerides: 314 mg/dL — ABNORMAL HIGH (ref 0.0–149.0)
VLDL: 62.8 mg/dL — ABNORMAL HIGH (ref 0.0–40.0)

## 2019-04-03 LAB — CBC WITH DIFFERENTIAL/PLATELET
Basophils Absolute: 0.1 10*3/uL (ref 0.0–0.1)
Basophils Relative: 1.3 % (ref 0.0–3.0)
Eosinophils Absolute: 0.1 10*3/uL (ref 0.0–0.7)
Eosinophils Relative: 2.4 % (ref 0.0–5.0)
HCT: 47 % (ref 39.0–52.0)
Hemoglobin: 15.9 g/dL (ref 13.0–17.0)
Lymphocytes Relative: 32.3 % (ref 12.0–46.0)
Lymphs Abs: 1.6 10*3/uL (ref 0.7–4.0)
MCHC: 33.9 g/dL (ref 30.0–36.0)
MCV: 91.5 fl (ref 78.0–100.0)
Monocytes Absolute: 0.4 10*3/uL (ref 0.1–1.0)
Monocytes Relative: 7.7 % (ref 3.0–12.0)
Neutro Abs: 2.8 10*3/uL (ref 1.4–7.7)
Neutrophils Relative %: 56.3 % (ref 43.0–77.0)
Platelets: 165 10*3/uL (ref 150.0–400.0)
RBC: 5.13 Mil/uL (ref 4.22–5.81)
RDW: 12.6 % (ref 11.5–15.5)
WBC: 5 10*3/uL (ref 4.0–10.5)

## 2019-04-03 LAB — URINALYSIS, ROUTINE W REFLEX MICROSCOPIC
Bilirubin Urine: NEGATIVE
Hgb urine dipstick: NEGATIVE
Ketones, ur: NEGATIVE
Leukocytes,Ua: NEGATIVE
Nitrite: NEGATIVE
RBC / HPF: NONE SEEN (ref 0–?)
Specific Gravity, Urine: 1.02 (ref 1.000–1.030)
Total Protein, Urine: NEGATIVE
Urine Glucose: NEGATIVE
Urobilinogen, UA: 0.2 (ref 0.0–1.0)
WBC, UA: NONE SEEN (ref 0–?)
pH: 6.5 (ref 5.0–8.0)

## 2019-04-03 LAB — VITAMIN B12: Vitamin B-12: 229 pg/mL (ref 211–911)

## 2019-04-03 LAB — HEPATIC FUNCTION PANEL
ALT: 24 U/L (ref 0–53)
AST: 18 U/L (ref 0–37)
Albumin: 4.3 g/dL (ref 3.5–5.2)
Alkaline Phosphatase: 76 U/L (ref 39–117)
Bilirubin, Direct: 0.2 mg/dL (ref 0.0–0.3)
Total Bilirubin: 1.2 mg/dL (ref 0.2–1.2)
Total Protein: 7.6 g/dL (ref 6.0–8.3)

## 2019-04-03 LAB — VITAMIN D 25 HYDROXY (VIT D DEFICIENCY, FRACTURES): VITD: 19.33 ng/mL — ABNORMAL LOW (ref 30.00–100.00)

## 2019-04-03 LAB — LDL CHOLESTEROL, DIRECT: Direct LDL: 120 mg/dL

## 2019-04-03 LAB — PSA: PSA: 0.72 ng/mL (ref 0.10–4.00)

## 2019-04-03 LAB — TSH: TSH: 1.81 u[IU]/mL (ref 0.35–4.50)

## 2019-04-03 MED ORDER — CYCLOBENZAPRINE HCL 5 MG PO TABS: 5.0000 mg | ORAL_TABLET | Freq: Three times a day (TID) | ORAL | 0 refills | Status: DC | PRN

## 2019-04-03 MED ORDER — ZOSTER VAC RECOMB ADJUVANTED 50 MCG/0.5ML IM SUSR: 0.5000 mL | Freq: Once | INTRAMUSCULAR | 1 refills | Status: DC

## 2019-04-03 MED ORDER — LOSARTAN POTASSIUM 50 MG PO TABS: 50.0000 mg | ORAL_TABLET | Freq: Every day | ORAL | 3 refills | Status: DC

## 2019-04-03 MED ORDER — VITAMIN D (ERGOCALCIFEROL) 1.25 MG (50000 UNIT) PO CAPS: 50000.0000 [IU] | ORAL_CAPSULE | ORAL | 0 refills | Status: DC

## 2019-04-03 MED ORDER — ZOSTER VAC RECOMB ADJUVANTED 50 MCG/0.5ML IM SUSR: 0.5000 mL | Freq: Once | INTRAMUSCULAR | 1 refills | Status: AC
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
## 2019-04-03 NOTE — Patient Instructions (Addendum)
We have discussed the Cardiac CT Score test to measure the calcification level (if any) in your heart arteries.  This test has been ordered in our [facility], so please call [facility] directly, as they prefer this, at [phone] to be scheduled.  You had the flu shot today  Please take all new medication as prescribed - the muscle relaxer  Your shingles shot prescription was sent to the pharmacy  Please continue all other medications as before, and refills have been done if requested - the losartan  Please have the pharmacy call with any other refills you may need.  Please continue your efforts at being more active, low cholesterol diet, and weight control.  You are otherwise up to date with prevention measures today.  Please keep your appointments with your specialists as you may have planned  Please go to the LAB in the Basement (turn left off the elevator) for the tests to be done today  /You will be contacted by phone if any changes need to be made immediately.  Otherwise, you will receive a letter about your results with an explanation, but please check with MyChart first.  Please remember to sign up for MyChart if you have not done so, as this will be important to you in the future with finding out test results, communicating by private email, and scheduling acute appointments online when needed.  Please return in 1 year for your yearly visit, or sooner if needed, with Lab testing done 3-5 days before

## 2019-04-04 LAB — HEPATITIS A ANTIBODY, TOTAL: Hepatitis A AB,Total: NONREACTIVE

## 2019-04-05 ENCOUNTER — Encounter: Payer: Self-pay | Admitting: Internal Medicine

## 2019-04-05 NOTE — Assessment & Plan Note (Signed)

## 2019-04-05 NOTE — Assessment & Plan Note (Signed)
stable overall by history and exam, recent data reviewed with pt, and pt to continue medical treatment as before,  to f/u any worsening symptoms or concerns  

## 2019-04-17 ENCOUNTER — Encounter: Payer: Self-pay | Admitting: Internal Medicine

## 2019-10-31 ENCOUNTER — Telehealth: Payer: Self-pay | Admitting: Internal Medicine

## 2019-10-31 NOTE — Telephone Encounter (Signed)
New message:    Pt is calling in regards to his shingles injection. Pt states he had one done but I am not see it. Please advise. Pt would like to schedule the second one.

## 2019-10-31 NOTE — Telephone Encounter (Signed)
New message:    Pt is calling and states he no longer needs a call back. He states he received the vaccine at [facility]. Please advise.

## 2020-05-28 ENCOUNTER — Emergency Department (HOSPITAL_COMMUNITY)
Admission: EM | Admit: 2020-05-28 | Discharge: 2020-05-29 | Disposition: A | Discharge status: Home / Self Care | Payer: No Typology Code available for payment source | Attending: Emergency Medicine | Admitting: Emergency Medicine

## 2020-05-28 ENCOUNTER — Emergency Department (HOSPITAL_COMMUNITY): Payer: No Typology Code available for payment source

## 2020-05-28 ENCOUNTER — Other Ambulatory Visit: Payer: Self-pay

## 2020-05-28 ENCOUNTER — Telehealth: Payer: Self-pay | Admitting: Internal Medicine

## 2020-05-28 DIAGNOSIS — R519 Headache, unspecified: Secondary | ICD-10-CM | POA: Diagnosis not present

## 2020-05-28 DIAGNOSIS — H539 Unspecified visual disturbance: Secondary | ICD-10-CM | POA: Insufficient documentation

## 2020-05-28 DIAGNOSIS — Z79899 Other long term (current) drug therapy: Secondary | ICD-10-CM | POA: Insufficient documentation

## 2020-05-28 DIAGNOSIS — I10 Essential (primary) hypertension: Secondary | ICD-10-CM | POA: Diagnosis not present

## 2020-05-28 LAB — CBC
HCT: 48.3 % (ref 39.0–52.0)
Hemoglobin: 15.8 g/dL (ref 13.0–17.0)
MCH: 30.3 pg (ref 26.0–34.0)
MCHC: 32.7 g/dL (ref 30.0–36.0)
MCV: 92.5 fL (ref 80.0–100.0)
Platelets: 188 10*3/uL (ref 150–400)
RBC: 5.22 MIL/uL (ref 4.22–5.81)
RDW: 11.5 % (ref 11.5–15.5)
WBC: 6.5 10*3/uL (ref 4.0–10.5)
nRBC: 0 % (ref 0.0–0.2)

## 2020-05-28 LAB — DIFFERENTIAL
Abs Immature Granulocytes: 0.01 10*3/uL (ref 0.00–0.07)
Basophils Absolute: 0.1 10*3/uL (ref 0.0–0.1)
Basophils Relative: 1 %
Eosinophils Absolute: 0.2 10*3/uL (ref 0.0–0.5)
Eosinophils Relative: 3 %
Immature Granulocytes: 0 %
Lymphocytes Relative: 31 %
Lymphs Abs: 2 10*3/uL (ref 0.7–4.0)
Monocytes Absolute: 0.5 10*3/uL (ref 0.1–1.0)
Monocytes Relative: 8 %
Neutro Abs: 3.7 10*3/uL (ref 1.7–7.7)
Neutrophils Relative %: 57 %

## 2020-05-28 LAB — COMPREHENSIVE METABOLIC PANEL
ALT: 49 U/L — ABNORMAL HIGH (ref 0–44)
AST: 31 U/L (ref 15–41)
Albumin: 3.8 g/dL (ref 3.5–5.0)
Alkaline Phosphatase: 68 U/L (ref 38–126)
Anion gap: 9 (ref 5–15)
BUN: 12 mg/dL (ref 6–20)
CO2: 27 mmol/L (ref 22–32)
Calcium: 8.9 mg/dL (ref 8.9–10.3)
Chloride: 101 mmol/L (ref 98–111)
Creatinine, Ser: 0.98 mg/dL (ref 0.61–1.24)
GFR, Estimated: >60 mL/min (ref >60)
Glucose, Bld: 102 mg/dL — ABNORMAL HIGH (ref 70–99)
Potassium: 4.3 mmol/L (ref 3.5–5.1)
Sodium: 137 mmol/L (ref 135–145)
Total Bilirubin: 1.1 mg/dL (ref 0.3–1.2)
Total Protein: 7.6 g/dL (ref 6.5–8.1)

## 2020-05-28 LAB — I-STAT CHEM 8, ED
BUN: 16 mg/dL (ref 6–20)
Calcium, Ion: 1.09 mmol/L — ABNORMAL LOW (ref 1.15–1.40)
Chloride: 102 mmol/L (ref 98–111)
Creatinine, Ser: 0.9 mg/dL (ref 0.61–1.24)
Glucose, Bld: 98 mg/dL (ref 70–99)
HCT: 48 % (ref 39.0–52.0)
Hemoglobin: 16.3 g/dL (ref 13.0–17.0)
Potassium: 4.3 mmol/L (ref 3.5–5.1)
Sodium: 138 mmol/L (ref 135–145)
TCO2: 27 mmol/L (ref 22–32)

## 2020-05-28 LAB — PROTIME-INR
INR: 1 (ref 0.8–1.2)
Prothrombin Time: 12.5 seconds (ref 11.4–15.2)

## 2020-05-28 LAB — APTT: aPTT: 33 seconds (ref 24–36)

## 2020-05-28 NOTE — Telephone Encounter (Signed)
[facility] FYI   Triage attempted two times on 05/28/20 to reach the patient. Unable to leave a message.

## 2020-05-28 NOTE — ED Triage Notes (Signed)
Pt reports Sunday while at a party he developed blurry vision/seeing spots along with a headache. Went home, symptoms resolved. Yesterday at 1400 same symptoms occurred, plus loss of peripheral vision in both eyes. Symptoms resolved again but did occur at 1400 again today but symptoms have totally resolved on arrival to ED.

## 2020-05-28 NOTE — Telephone Encounter (Signed)
Patient called and said that he started experiencing vision issues on Sunday 12.26.21 and it will last about an hour or so and then his vision will go back to normal. It have also happened 12.2721 and 12.28.21. He said when this happens it is hard for him to focus.  Transferred to [facility].

## 2020-05-29 ENCOUNTER — Emergency Department (HOSPITAL_COMMUNITY): Payer: No Typology Code available for payment source

## 2020-05-29 MED ORDER — GADOBUTROL 1 MMOL/ML IV SOLN
9.0000 mL | Freq: Once | INTRAVENOUS | Status: AC | PRN
  Administered 2020-05-29: 12:00:00 9 mL via INTRAVENOUS

## 2020-05-29 MED ORDER — HYDRALAZINE HCL 20 MG/ML IJ SOLN
10.0000 mg | Freq: Once | INTRAMUSCULAR | Status: AC
  Administered 2020-05-29: 10:00:00 10 mg via INTRAVENOUS
  Filled 2020-05-29: qty 1

## 2020-05-29 NOTE — Discharge Instructions (Signed)
Go to Dr. Vonna Kotyk' office at 3:45 PM for work-up of your eye abnormalities.  Call your doctor tomorrow to help adjust your blood pressure medicine for your elevated blood pressure.  If you develop continued, recurrent, or worsening headache, fever, neck stiffness, vomiting, blurry or double vision, weakness or numbness in your arms or legs, trouble speaking, or any other new/concerning symptoms then return to the ER for evaluation.

## 2020-05-29 NOTE — ED Notes (Addendum)
Pt back from MRI 

## 2020-05-29 NOTE — ED Notes (Signed)
Reviewed discharge instructions with patient. Follow-up care reviewed. Patient verbalized understanding. Patient A&Ox4, VSS, and ambulatory with steady gait upon discharge.  

## 2020-05-29 NOTE — ED Notes (Signed)
Patient transported to MRI 
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
## 2020-05-29 NOTE — ED Notes (Signed)
Pt still in MRI 

## 2020-06-03 ENCOUNTER — Other Ambulatory Visit: Payer: Self-pay

## 2020-06-03 ENCOUNTER — Encounter: Payer: Self-pay | Admitting: Internal Medicine

## 2020-06-03 ENCOUNTER — Ambulatory Visit (INDEPENDENT_AMBULATORY_CARE_PROVIDER_SITE_OTHER): Payer: BC Managed Care – PPO | Admitting: Internal Medicine

## 2020-06-03 VITALS — BP 160/102 | HR 73 | Temp 98.7°F | Ht 74.0 in | Wt 220.0 lb

## 2020-06-03 DIAGNOSIS — G43109 Migraine with aura, not intractable, without status migrainosus: Secondary | ICD-10-CM | POA: Diagnosis not present

## 2020-06-03 DIAGNOSIS — E538 Deficiency of other specified B group vitamins: Secondary | ICD-10-CM | POA: Diagnosis not present

## 2020-06-03 DIAGNOSIS — E559 Vitamin D deficiency, unspecified: Secondary | ICD-10-CM | POA: Diagnosis not present

## 2020-06-03 DIAGNOSIS — E7849 Other hyperlipidemia: Secondary | ICD-10-CM

## 2020-06-03 DIAGNOSIS — Z0001 Encounter for general adult medical examination with abnormal findings: Secondary | ICD-10-CM

## 2020-06-03 DIAGNOSIS — M5442 Lumbago with sciatica, left side: Secondary | ICD-10-CM

## 2020-06-03 DIAGNOSIS — Z0000 Encounter for general adult medical examination without abnormal findings: Secondary | ICD-10-CM

## 2020-06-03 DIAGNOSIS — I10 Essential (primary) hypertension: Secondary | ICD-10-CM

## 2020-06-03 DIAGNOSIS — R739 Hyperglycemia, unspecified: Secondary | ICD-10-CM

## 2020-06-03 LAB — URINALYSIS, ROUTINE W REFLEX MICROSCOPIC
Bilirubin Urine: NEGATIVE
Hgb urine dipstick: NEGATIVE
Ketones, ur: NEGATIVE
Leukocytes,Ua: NEGATIVE
Nitrite: NEGATIVE
RBC / HPF: NONE SEEN (ref 0–?)
Specific Gravity, Urine: 1.02 (ref 1.000–1.030)
Total Protein, Urine: NEGATIVE
Urine Glucose: NEGATIVE
Urobilinogen, UA: 0.2 (ref 0.0–1.0)
WBC, UA: NONE SEEN (ref 0–?)
pH: 6 (ref 5.0–8.0)

## 2020-06-03 LAB — BASIC METABOLIC PANEL
BUN: 15 mg/dL (ref 6–23)
CO2: 30 mEq/L (ref 19–32)
Calcium: 9.7 mg/dL (ref 8.4–10.5)
Chloride: 101 mEq/L (ref 96–112)
Creatinine, Ser: 0.97 mg/dL (ref 0.40–1.50)
GFR: 88.77 mL/min (ref >60.00)
Glucose, Bld: 86 mg/dL (ref 70–99)
Potassium: 4.5 mEq/L (ref 3.5–5.1)
Sodium: 138 mEq/L (ref 135–145)

## 2020-06-03 LAB — HEMOGLOBIN A1C: Hgb A1c MFr Bld: 5.5 % (ref 4.6–6.5)

## 2020-06-03 LAB — LIPID PANEL
Cholesterol: 220 mg/dL — ABNORMAL HIGH (ref 0–200)
HDL: 41 mg/dL (ref >39.00)
Total CHOL/HDL Ratio: 5
Triglycerides: 451 mg/dL — ABNORMAL HIGH (ref 0.0–149.0)

## 2020-06-03 LAB — VITAMIN B12: Vitamin B-12: 260 pg/mL (ref 211–911)

## 2020-06-03 LAB — TSH: TSH: 2.62 u[IU]/mL (ref 0.35–4.50)

## 2020-06-03 LAB — PSA: PSA: 0.84 ng/mL (ref 0.10–4.00)

## 2020-06-03 LAB — LDL CHOLESTEROL, DIRECT: Direct LDL: 120 mg/dL

## 2020-06-03 LAB — VITAMIN D 25 HYDROXY (VIT D DEFICIENCY, FRACTURES): VITD: 19.11 ng/mL — ABNORMAL LOW (ref 30.00–100.00)

## 2020-06-03 MED ORDER — SUMATRIPTAN SUCCINATE 100 MG PO TABS: 100.0000 mg | ORAL_TABLET | ORAL | 11 refills | Status: DC | PRN

## 2020-06-03 MED ORDER — METOPROLOL TARTRATE 50 MG PO TABS: 50.0000 mg | ORAL_TABLET | Freq: Every day | ORAL | 3 refills | Status: DC

## 2020-06-03 NOTE — Patient Instructions (Addendum)
We have discussed the Cardiac CT Score test to measure the calcification level (if any) in your heart arteries.  This test has been ordered in our [facility], so please call [facility] directly, as they prefer this, at [phone] to be scheduled.  Please take all new medication as prescribed - the toprol XL 50 mg per day  After 5 days, please check the BP daily and let us know by mychart about the average BP in 10 days  Please take all new medication as prescribed - the imitrex as needed for migraine  You will be contacted regarding the referral for: Neurology  Please continue all other medications as before, and refills have been done if requested.  Please have the pharmacy call with any other refills you may need.  Please continue your efforts at being more active, low cholesterol diet, and weight control.  You are otherwise up to date with prevention measures today.  Please keep your appointments with your specialists as you may have planned  Please go to the LAB at the blood drawing area for the tests to be done  You will be contacted by phone if any changes need to be made immediately.  Otherwise, you will receive a letter about your results with an explanation, but please check with MyChart first.  Please remember to sign up for MyChart if you have not done so, as this will be important to you in the future with finding out test results, communicating by private email, and scheduling acute appointments online when needed.  Please make an Appointment to return in 6 months, or sooner if needed  OK to cancel the Jan 30 appt  Good luck with the Campaign!
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
## 2020-06-05 ENCOUNTER — Encounter: Payer: Self-pay | Admitting: Neurology

## 2020-06-14 DIAGNOSIS — Z1152 Encounter for screening for COVID-19: Secondary | ICD-10-CM | POA: Diagnosis not present

## 2020-06-17 ENCOUNTER — Encounter: Payer: Self-pay | Admitting: Internal Medicine

## 2020-06-17 DIAGNOSIS — E559 Vitamin D deficiency, unspecified: Secondary | ICD-10-CM | POA: Insufficient documentation

## 2020-06-17 NOTE — Assessment & Plan Note (Signed)
Lab Results  Component Value Date   LDLCALC 82 01/21/2018   Stable, pt to continue to decline statin, for lower chol diet, f/u lipids   Current Outpatient Medications (Cardiovascular):  .  metoprolol tartrate (LOPRESSOR) 50 MG tablet, Take 1 tablet (50 mg total) by mouth daily.   Current Outpatient Medications (Analgesics):  Marland Kitchen  SUMAtriptan (IMITREX) 100 MG tablet, Take 1 tablet (100 mg total) by mouth every 2 (two) hours as needed for migraine or headache. May repeat in 2 hours if headache persists or recurs.   Current Outpatient Medications (Other):  .  ascorbic acid (VITAMIN C) 500 MG tablet, Take 500 mg by mouth daily. .  cholecalciferol (VITAMIN D) 25 MCG (1000 UNIT) tablet, Take 1,000 Units by mouth daily. .  Multiple Vitamins-Minerals (ZINC PO), Take 1 tablet by mouth daily. .  Turmeric 500 MG CAPS, Take 500 mg by mouth daily.

## 2020-06-17 NOTE — Assessment & Plan Note (Signed)
Lab Results  Component Value Date   HGBA1C 5.5 06/03/2020   Stable, pt to continue current medical treatment  - diet

## 2020-06-17 NOTE — Assessment & Plan Note (Signed)

## 2020-06-17 NOTE — Assessment & Plan Note (Signed)
For vit d lab f/u 

## 2020-06-17 NOTE — Assessment & Plan Note (Signed)
BP Readings from Last 3 Encounters:  06/03/20 (!) 160/102  05/29/20 (!) 159/108  04/03/19 130/90   Stable, pt to start lopressor   Current Outpatient Medications (Cardiovascular):  .  metoprolol tartrate (LOPRESSOR) 50 MG tablet, Take 1 tablet (50 mg total) by mouth daily.   Current Outpatient Medications (Analgesics):  Marland Kitchen  SUMAtriptan (IMITREX) 100 MG tablet, Take 1 tablet (100 mg total) by mouth every 2 (two) hours as needed for migraine or headache. May repeat in 2 hours if headache persists or recurs.   Current Outpatient Medications (Other):  .  ascorbic acid (VITAMIN C) 500 MG tablet, Take 500 mg by mouth daily. .  cholecalciferol (VITAMIN D) 25 MCG (1000 UNIT) tablet, Take 1,000 Units by mouth daily. .  Multiple Vitamins-Minerals (ZINC PO), Take 1 tablet by mouth daily. .  Turmeric 500 MG CAPS, Take 500 mg by mouth daily.

## 2020-06-17 NOTE — Assessment & Plan Note (Signed)
S/p optho exam benign, for lopressor prevention, imitrex prn, and neruology referral

## 2020-06-25 ENCOUNTER — Encounter: Payer: Self-pay | Admitting: Internal Medicine

## 2020-07-01 ENCOUNTER — Encounter: Payer: No Typology Code available for payment source | Admitting: Internal Medicine

## 2020-07-26 ENCOUNTER — Encounter: Payer: Self-pay | Admitting: Internal Medicine

## 2020-07-26 MED ORDER — AMLODIPINE BESYLATE 10 MG PO TABS: 10.0000 mg | ORAL_TABLET | Freq: Every day | ORAL | 3 refills | Status: DC
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
## 2020-08-02 ENCOUNTER — Other Ambulatory Visit: Payer: Self-pay

## 2020-08-02 ENCOUNTER — Encounter: Payer: Self-pay | Admitting: Neurology

## 2020-08-02 ENCOUNTER — Ambulatory Visit (INDEPENDENT_AMBULATORY_CARE_PROVIDER_SITE_OTHER): Payer: BC Managed Care – PPO | Admitting: Neurology

## 2020-08-02 VITALS — BP 134/84 | HR 49 | Ht 74.0 in | Wt 218.2 lb

## 2020-08-02 DIAGNOSIS — G43109 Migraine with aura, not intractable, without status migrainosus: Secondary | ICD-10-CM

## 2020-08-02 NOTE — Patient Instructions (Signed)
I think that you had an ocular migraine for those 3 days.  Hopefully it won't recur or only occur infrequently.  If there are any new symptoms, increased frequency of episodes or any concerns, please follow up.

## 2020-12-06 ENCOUNTER — Ambulatory Visit (INDEPENDENT_AMBULATORY_CARE_PROVIDER_SITE_OTHER): Payer: BC Managed Care – PPO | Admitting: Internal Medicine

## 2020-12-06 ENCOUNTER — Other Ambulatory Visit: Payer: Self-pay

## 2020-12-06 ENCOUNTER — Encounter: Payer: Self-pay | Admitting: Internal Medicine

## 2020-12-06 VITALS — BP 132/80 | HR 55 | Temp 98.1°F | Ht 74.0 in | Wt 220.0 lb

## 2020-12-06 DIAGNOSIS — I10 Essential (primary) hypertension: Secondary | ICD-10-CM | POA: Diagnosis not present

## 2020-12-06 DIAGNOSIS — E538 Deficiency of other specified B group vitamins: Secondary | ICD-10-CM

## 2020-12-06 DIAGNOSIS — R739 Hyperglycemia, unspecified: Secondary | ICD-10-CM

## 2020-12-06 DIAGNOSIS — Z0001 Encounter for general adult medical examination with abnormal findings: Secondary | ICD-10-CM

## 2020-12-06 DIAGNOSIS — E7849 Other hyperlipidemia: Secondary | ICD-10-CM | POA: Diagnosis not present

## 2020-12-06 DIAGNOSIS — E559 Vitamin D deficiency, unspecified: Secondary | ICD-10-CM | POA: Diagnosis not present

## 2020-12-06 MED ORDER — METOPROLOL SUCCINATE ER 25 MG PO TB24: 25.0000 mg | ORAL_TABLET | Freq: Every day | ORAL | 3 refills | Status: DC

## 2020-12-06 MED ORDER — ROSUVASTATIN CALCIUM 10 MG PO TABS: 10.0000 mg | ORAL_TABLET | Freq: Every day | ORAL | 3 refills | Status: DC

## 2020-12-06 NOTE — Patient Instructions (Signed)
Please take all new medication as prescribed - the toprol xl 25 mg per day, and the crestor 10 mg per day  Please continue all other medications as before, including the amlodipine 10 mg per day  Please take OTC Vitamin D3 at 2000 units per day, indefinitely, as you are  Please have the pharmacy call with any other refills you may need.  Please continue your efforts at being more active, low cholesterol diet, and weight control.  Please keep your appointments with your specialists as you may have planned  Please make an Appointment to return in 6 months, or sooner if needed, also with Lab Appointment for testing done 3-5 days before at the [place] (so this is for TWO appointments - please see the scheduling desk as you leave)  Due to the ongoing Covid 19 pandemic, our lab now requires an appointment for any labs done at our office.  If you need labs done and do not have an appointment, please call our office ahead of time to schedule before presenting to the lab for your testing.
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
## 2020-12-06 NOTE — Assessment & Plan Note (Signed)
Last vitamin D Lab Results  Component Value Date   VD25OH 19.11 (L) 06/03/2020   Low, to start oral replacement

## 2020-12-10 NOTE — Assessment & Plan Note (Signed)
BP Readings from Last 3 Encounters:  12/06/20 132/80  08/02/20 134/84  06/03/20 (!) 160/102   Uncontrolled,, pt to continue medical treatment  - cont amlodipine and add toprol xl 25 qd

## 2020-12-10 NOTE — Assessment & Plan Note (Signed)
Lab Results  Component Value Date   LDLCALC 82 01/21/2018   Uncontrolled, goal ldl < 70, for crestor 10 qd

## 2020-12-10 NOTE — Assessment & Plan Note (Signed)
Lab Results  Component Value Date   HGBA1C 5.5 06/03/2020   Stable, pt to continue current medical treatment  - diet

## 2021-04-17 ENCOUNTER — Telehealth: Payer: Self-pay | Admitting: Internal Medicine

## 2021-04-17 NOTE — Telephone Encounter (Signed)
Type of form received: Wellness Preventive Care Form  Form placed in: Provider mailbox  Additional instructions from the patient email the patient the completed form at [email or web address]  Things to remember: [facility] office: If form received in person, remind patient that forms take 7-10 business days CMA should attach charge sheet and put on [employer]

## 2021-04-18 NOTE — Telephone Encounter (Signed)
Patient notified via voicemail that form is completed and emailed

## 2021-05-09 ENCOUNTER — Other Ambulatory Visit: Payer: Self-pay

## 2021-05-09 ENCOUNTER — Other Ambulatory Visit (INDEPENDENT_AMBULATORY_CARE_PROVIDER_SITE_OTHER): Payer: BC Managed Care – PPO

## 2021-05-09 DIAGNOSIS — E538 Deficiency of other specified B group vitamins: Secondary | ICD-10-CM | POA: Diagnosis not present

## 2021-05-09 DIAGNOSIS — Z125 Encounter for screening for malignant neoplasm of prostate: Secondary | ICD-10-CM | POA: Diagnosis not present

## 2021-05-09 DIAGNOSIS — E7849 Other hyperlipidemia: Secondary | ICD-10-CM | POA: Diagnosis not present

## 2021-05-09 DIAGNOSIS — R739 Hyperglycemia, unspecified: Secondary | ICD-10-CM | POA: Diagnosis not present

## 2021-05-09 DIAGNOSIS — E559 Vitamin D deficiency, unspecified: Secondary | ICD-10-CM | POA: Diagnosis not present

## 2021-05-09 DIAGNOSIS — Z0001 Encounter for general adult medical examination with abnormal findings: Secondary | ICD-10-CM

## 2021-05-09 LAB — URINALYSIS, ROUTINE W REFLEX MICROSCOPIC
Bilirubin Urine: NEGATIVE
Hgb urine dipstick: NEGATIVE
Ketones, ur: NEGATIVE
Leukocytes,Ua: NEGATIVE
Nitrite: NEGATIVE
Specific Gravity, Urine: >=1.03 — AB (ref 1.000–1.030)
Total Protein, Urine: NEGATIVE
Urine Glucose: NEGATIVE
Urobilinogen, UA: 0.2 (ref 0.0–1.0)
pH: 6 (ref 5.0–8.0)

## 2021-05-09 LAB — BASIC METABOLIC PANEL
BUN: 17 mg/dL (ref 6–23)
CO2: 27 mEq/L (ref 19–32)
Calcium: 9 mg/dL (ref 8.4–10.5)
Chloride: 102 mEq/L (ref 96–112)
Creatinine, Ser: 0.98 mg/dL (ref 0.40–1.50)
GFR: 87.11 mL/min (ref >60.00)
Glucose, Bld: 182 mg/dL — ABNORMAL HIGH (ref 70–99)
Potassium: 4.1 mEq/L (ref 3.5–5.1)
Sodium: 136 mEq/L (ref 135–145)

## 2021-05-09 LAB — HEPATIC FUNCTION PANEL
ALT: 38 U/L (ref 0–53)
AST: 24 U/L (ref 0–37)
Albumin: 3.8 g/dL (ref 3.5–5.2)
Alkaline Phosphatase: 73 U/L (ref 39–117)
Bilirubin, Direct: 0.1 mg/dL (ref 0.0–0.3)
Total Bilirubin: 0.8 mg/dL (ref 0.2–1.2)
Total Protein: 6.8 g/dL (ref 6.0–8.3)

## 2021-05-09 LAB — CBC WITH DIFFERENTIAL/PLATELET
Basophils Absolute: 0 10*3/uL (ref 0.0–0.1)
Basophils Relative: 1 % (ref 0.0–3.0)
Eosinophils Absolute: 0.2 10*3/uL (ref 0.0–0.7)
Eosinophils Relative: 3.6 % (ref 0.0–5.0)
HCT: 44.7 % (ref 39.0–52.0)
Hemoglobin: 15.2 g/dL (ref 13.0–17.0)
Lymphocytes Relative: 35.2 % (ref 12.0–46.0)
Lymphs Abs: 1.6 10*3/uL (ref 0.7–4.0)
MCHC: 33.9 g/dL (ref 30.0–36.0)
MCV: 91.5 fl (ref 78.0–100.0)
Monocytes Absolute: 0.2 10*3/uL (ref 0.1–1.0)
Monocytes Relative: 5.5 % (ref 3.0–12.0)
Neutro Abs: 2.4 10*3/uL (ref 1.4–7.7)
Neutrophils Relative %: 54.7 % (ref 43.0–77.0)
Platelets: 170 10*3/uL (ref 150.0–400.0)
RBC: 4.89 Mil/uL (ref 4.22–5.81)
RDW: 12.7 % (ref 11.5–15.5)
WBC: 4.4 10*3/uL (ref 4.0–10.5)

## 2021-05-09 LAB — LDL CHOLESTEROL, DIRECT: Direct LDL: 77 mg/dL

## 2021-05-09 LAB — LIPID PANEL
Cholesterol: 143 mg/dL (ref 0–200)
HDL: 41.4 mg/dL (ref >39.00)
NonHDL: 101.93
Total CHOL/HDL Ratio: 3
Triglycerides: 268 mg/dL — ABNORMAL HIGH (ref 0.0–149.0)
VLDL: 53.6 mg/dL — ABNORMAL HIGH (ref 0.0–40.0)

## 2021-05-09 LAB — TSH: TSH: 2.94 u[IU]/mL (ref 0.35–5.50)

## 2021-05-09 LAB — VITAMIN D 25 HYDROXY (VIT D DEFICIENCY, FRACTURES): VITD: 14.4 ng/mL — ABNORMAL LOW (ref 30.00–100.00)

## 2021-05-09 LAB — VITAMIN B12: Vitamin B-12: 170 pg/mL — ABNORMAL LOW (ref 211–911)

## 2021-05-09 LAB — PSA: PSA: 0.75 ng/mL (ref 0.10–4.00)

## 2021-05-09 LAB — HEMOGLOBIN A1C: Hgb A1c MFr Bld: 5.8 % (ref 4.6–6.5)

## 2021-05-12 ENCOUNTER — Ambulatory Visit (INDEPENDENT_AMBULATORY_CARE_PROVIDER_SITE_OTHER): Payer: BC Managed Care – PPO | Admitting: Internal Medicine

## 2021-05-12 ENCOUNTER — Encounter: Payer: Self-pay | Admitting: Internal Medicine

## 2021-05-12 ENCOUNTER — Other Ambulatory Visit: Payer: Self-pay

## 2021-05-12 VITALS — BP 144/82 | HR 54 | Temp 98.4°F | Ht 74.0 in | Wt 221.0 lb

## 2021-05-12 DIAGNOSIS — E7800 Pure hypercholesterolemia, unspecified: Secondary | ICD-10-CM | POA: Diagnosis not present

## 2021-05-12 DIAGNOSIS — Z0001 Encounter for general adult medical examination with abnormal findings: Secondary | ICD-10-CM

## 2021-05-12 DIAGNOSIS — E538 Deficiency of other specified B group vitamins: Secondary | ICD-10-CM | POA: Insufficient documentation

## 2021-05-12 DIAGNOSIS — E559 Vitamin D deficiency, unspecified: Secondary | ICD-10-CM

## 2021-05-12 DIAGNOSIS — I10 Essential (primary) hypertension: Secondary | ICD-10-CM | POA: Diagnosis not present

## 2021-05-12 DIAGNOSIS — R739 Hyperglycemia, unspecified: Secondary | ICD-10-CM

## 2021-05-12 NOTE — Patient Instructions (Signed)
We have discussed the Cardiac CT Score test to measure the calcification level (if any) in your heart arteries.  This test has been ordered in our [place], so please call [facility] directly, as they prefer this, at [phone] to be scheduled.  Please take OTC Vitamin D3 at 2000 units per day, indefinitely, or 4000 units if you already take the 2000 units.    Please take B12 1000 mcg per day for at least 6 months  Please continue all other medications as before, and refills have been done if requested.  Please have the pharmacy call with any other refills you may need.  Please continue your efforts at being more active, low cholesterol diet, and weight control  Please keep your appointments with your specialists as you may have planned  Please make an Appointment to return in July 2023, or sooner if needed, also with Lab Appointment for testing done 3-5 days before at the [facility] (so this is for TWO appointments - please see the scheduling desk as you leave)  Due to the ongoing Covid 19 pandemic, our lab now requires an appointment for any labs done at our office.  If you need labs done and do not have an appointment, please call our office ahead of time to schedule before presenting to the lab for your testing.
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
## 2021-05-15 ENCOUNTER — Encounter: Payer: Self-pay | Admitting: Internal Medicine

## 2021-05-15 NOTE — Assessment & Plan Note (Signed)
Lab Results  Component Value Date   HGBA1C 5.8 05/09/2021   Stable, pt to continue current medical treatment  - diet

## 2021-05-15 NOTE — Assessment & Plan Note (Signed)
Lab Results  Component Value Date   LDLCALC 82 01/21/2018   Stable, pt to continue current statin crestor 10, and check cardiac CT score

## 2021-05-15 NOTE — Assessment & Plan Note (Signed)
Lab Results  Component Value Date   VITAMINB12 170 (L) 05/09/2021   Low, to start oral replacement - b12 1000 mcg qd

## 2021-05-15 NOTE — Assessment & Plan Note (Signed)
Last vitamin D Lab Results  Component Value Date   VD25OH 14.40 (L) 05/09/2021   Low, to start oral replacement

## 2021-05-15 NOTE — Assessment & Plan Note (Signed)
Mild to mod uncontrolled, for contd same tx, and work on lifestyle changes per pt, declines further med change today, to f/u any worsening symptoms or concerns and BP at home and next visit

## 2021-06-04 ENCOUNTER — Other Ambulatory Visit: Payer: Self-pay | Admitting: Internal Medicine

## 2021-06-04 NOTE — Telephone Encounter (Signed)
Please refill as per office routine med refill policy (all routine meds to be refilled for 3 mo or monthly (per pt preference) up to one year from last visit, then month to month grace period for 3 mo, then further med refills will have to be denied) ? ?

## 2021-07-01 ENCOUNTER — Other Ambulatory Visit: Payer: Self-pay

## 2021-07-01 ENCOUNTER — Ambulatory Visit (INDEPENDENT_AMBULATORY_CARE_PROVIDER_SITE_OTHER)
Admission: RE | Admit: 2021-07-01 | Discharge: 2021-07-01 | Disposition: A | Discharge status: Home / Self Care | Payer: Self-pay | Source: Ambulatory Visit | Attending: Internal Medicine | Admitting: Internal Medicine

## 2021-07-01 DIAGNOSIS — E7849 Other hyperlipidemia: Secondary | ICD-10-CM

## 2021-07-01 DIAGNOSIS — I10 Essential (primary) hypertension: Secondary | ICD-10-CM

## 2021-07-01 DIAGNOSIS — R739 Hyperglycemia, unspecified: Secondary | ICD-10-CM

## 2021-07-02 ENCOUNTER — Telehealth: Payer: Self-pay

## 2021-07-02 ENCOUNTER — Other Ambulatory Visit: Payer: Self-pay | Admitting: Internal Medicine

## 2021-07-02 MED ORDER — ASPIRIN 81 MG PO TBEC: 81.0000 mg | DELAYED_RELEASE_TABLET | Freq: Every day | ORAL | 12 refills | Status: DC

## 2021-07-02 MED ORDER — ROSUVASTATIN CALCIUM 20 MG PO TABS: 20.0000 mg | ORAL_TABLET | Freq: Every day | ORAL | 3 refills | Status: DC

## 2021-07-02 NOTE — Telephone Encounter (Signed)
Pt calling in for results. I advise the pt of Dr. Jenny Reichmann recommendation and the new medications sent to pharmacy.   Pt understood and agreed to the new treatments recommendations.  FYI

## 2021-07-15 ENCOUNTER — Encounter: Payer: Self-pay | Admitting: Internal Medicine

## 2021-07-15 ENCOUNTER — Other Ambulatory Visit: Payer: Self-pay

## 2021-07-15 ENCOUNTER — Ambulatory Visit (INDEPENDENT_AMBULATORY_CARE_PROVIDER_SITE_OTHER): Payer: 59

## 2021-07-15 ENCOUNTER — Ambulatory Visit (INDEPENDENT_AMBULATORY_CARE_PROVIDER_SITE_OTHER): Payer: 59 | Admitting: Internal Medicine

## 2021-07-15 VITALS — BP 160/90 | HR 70 | Temp 98.0°F | Ht 74.0 in | Wt 219.6 lb

## 2021-07-15 DIAGNOSIS — M5441 Lumbago with sciatica, right side: Secondary | ICD-10-CM

## 2021-07-15 MED ORDER — GABAPENTIN 100 MG PO CAPS: 200.0000 mg | ORAL_CAPSULE | Freq: Every day | ORAL | 1 refills | Status: DC

## 2021-07-15 MED ORDER — PREDNISONE 10 MG PO TABS: ORAL_TABLET | ORAL | 0 refills | Status: DC

## 2021-07-15 MED ORDER — CYCLOBENZAPRINE HCL 5 MG PO TABS
5.0000 mg | ORAL_TABLET | Freq: Three times a day (TID) | ORAL | 0 refills | Status: DC | PRN
Start: 2021-07-15 — End: 2021-12-15
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
## 2021-07-15 NOTE — Patient Instructions (Addendum)
° °  Have an xray downstairs   Medications changes include :     prednisone taper - take with breakfast.   Flexeril (muscle relaxer ) take up to three times a day as needed.   Gabapentin 200-300 ( nerve pain) at bedtime.     Your prescription(s) have been submitted to your pharmacy. Please take as directed and contact our office if you believe you are having problem(s) with the medication(s).   Let us know if you are not improving let us know.  We can refer you to PT or a specialist if needed.

## 2021-07-29 ENCOUNTER — Encounter: Payer: Self-pay | Admitting: Internal Medicine

## 2021-07-29 DIAGNOSIS — R29898 Other symptoms and signs involving the musculoskeletal system: Secondary | ICD-10-CM

## 2021-07-29 DIAGNOSIS — M549 Dorsalgia, unspecified: Secondary | ICD-10-CM

## 2021-07-30 NOTE — Progress Notes (Signed)
? ? George Molina ?[facility] ?[place] ?Phone: [phone] ?  ?Assessment and Plan:   ?  ?1. Numbness of right foot ?2. Acute right-sided low back pain with right-sided sciatica ?-Acute, improving, initial sports medicine visit ?- I suspect that patient may have had a herniated disc in L5-S1 region versus L4-L5 region causing sciatica and leading to right-sided foot numbness and foot drop. ?- Patient has had significant improvement in symptoms with relative rest, chiropractic treatments, prednisone, gabapentin ?-Since sensation has significantly improved, foot weakness has significantly improved, we will not proceed with an MRI at this time.  If numbness or weakness should worsen, we could order a lumbar spine MRI at that time ?- May continue gabapentin and Flexeril as needed ?- May use Tylenol/NSAIDs as needed for pain control ? ?Pertinent previous records reviewed include PCP note 07/15/2021, PCP telephone note message 07/29/2021, lumbar spine x-ray 07/15/2021 ?  ?Follow Up: As needed if no improvement or worsening of symptoms.  Would order lumbar MRI at that time ?  ?Subjective:   ?I, Pincus Badder, am serving as a Education administrator for [employer] ? ?Chief Complaint: back pain and lower body weakness ? ?HPI:  ?07/31/2021 ?Patient is a 56 year old male complaining of back pain with lower body weakness. Patient states that has been going on for year. Two weeks ago he was stretching and felt a pull and went to chiro ad PCP and the pain started to subside 1.5/10 pain throbbing nerve sciatica pain but since he's been on prednisone his right foot is numb and is making him walk with an altered gait, Monday tried to do toe raises and he wasn't able to move his foot / push off can do double leg heel raise but not able to do single right leg raise , when he's walking he's pulling the foot forward . Isn't able to activate the muscle in his feet to do a heel  raise  ? ?Relevant Historical Information: Lumbar DDD ? ?Additional pertinent review of systems negative. ? ? ?Current Outpatient Medications:  ?  amLODipine (NORVASC) 10 MG tablet, TAKE 1 TABLET BY MOUTH EVERY DAY, Disp: 90 tablet, Rfl: 3 ?  ascorbic acid (VITAMIN C) 500 MG tablet, Take 500 mg by mouth daily., Disp: , Rfl:  ?  aspirin 81 MG EC tablet, Take 1 tablet (81 mg total) by mouth daily. Swallow whole., Disp: 30 tablet, Rfl: 12 ?  cholecalciferol (VITAMIN D) 25 MCG (1000 UNIT) tablet, Take 1,000 Units by mouth daily., Disp: , Rfl:  ?  cyclobenzaprine (FLEXERIL) 5 MG tablet, Take 1-2 tablets (5-10 mg total) by mouth 3 (three) times daily as needed for muscle spasms., Disp: 30 tablet, Rfl: 0 ?  metoprolol succinate (TOPROL-XL) 25 MG 24 hr tablet, Take 1 tablet (25 mg total) by mouth daily., Disp: 90 tablet, Rfl: 3 ?  Multiple Vitamins-Minerals (ZINC PO), Take 1 tablet by mouth daily., Disp: , Rfl:  ?  rosuvastatin (CRESTOR) 20 MG tablet, Take 1 tablet (20 mg total) by mouth daily., Disp: 90 tablet, Rfl: 3 ?  SUMAtriptan (IMITREX) 100 MG tablet, Take 1 tablet (100 mg total) by mouth every 2 (two) hours as needed for migraine or headache. May repeat in 2 hours if headache persists or recurs., Disp: 10 tablet, Rfl: 11 ?  Turmeric 500 MG CAPS, Take 500 mg by mouth daily., Disp: , Rfl:  ?  gabapentin (NEURONTIN) 100 MG capsule, Take 2-3 capsules (200-300  mg total) by mouth at bedtime. (Patient not taking: Reported on 07/31/2021), Disp: 90 capsule, Rfl: 1 ?  predniSONE (DELTASONE) 10 MG tablet, Take 4 tabs po qd x 3 days, then 3 tabs po qd x 3 days, then 2 tabs po qd x 3 days, then 1 tab po qd x 3 days (Patient not taking: Reported on 07/31/2021), Disp: 30 tablet, Rfl: 0  ? ?Objective:   ?  ?Vitals:  ? 07/31/21 1503  ?BP: 132/80  ?Pulse: 74  ?SpO2: 94%  ?Weight: 221 lb (100.2 kg)  ?Height: 6\' 2"  (1.88 m)  ?  ?  ?Body mass index is 28.37 kg/m?.  ?  ?Physical Exam:   ? ?Gen: Appears well, nad, nontoxic and  pleasant ?Psych: Alert and oriented, appropriate mood and affect ?Neuro: sensation intact, strength is 5/5 in upper and lower extremities, muscle tone wnl ?Skin: no susupicious lesions or rashes ? ?Back - Normal skin, Spine with normal alignment and no deformity.   ?No tenderness to vertebral process palpation.   ?Paraspinous muscles are not tender and without spasm ?Straight leg raise negative ?Trendelenberg negative ?  ? ?Right ankle: no deformity, no swelling or effusion ?NTTP over fibular head, lat mal, medial mal, achilles, navicular, base of 5th, ATFL, CFL, deltoid, calcaneous or midfoot ?ROM DF 30, PF 45, inv/ev intact ?Negative ant drawer, talar tilt, rotation test, squeeze test. ?Neg thompson ?No pain with resisted inversion or eversion  ?Palpation of fibular head does not reproduce symptoms ? ?Electronically signed by:  ?George Molina ?[facility] ?3:33 PM 07/31/21 ?

## 2021-07-30 NOTE — Addendum Note (Signed)
Addended by: Binnie Rail on: 07/30/2021 12:25 PM ? ? Modules accepted: Orders ? ?

## 2021-07-31 ENCOUNTER — Ambulatory Visit (INDEPENDENT_AMBULATORY_CARE_PROVIDER_SITE_OTHER): Payer: 59 | Admitting: Sports Medicine

## 2021-07-31 ENCOUNTER — Other Ambulatory Visit: Payer: Self-pay

## 2021-07-31 VITALS — BP 132/80 | HR 74 | Ht 74.0 in | Wt 221.0 lb

## 2021-07-31 DIAGNOSIS — R200 Anesthesia of skin: Secondary | ICD-10-CM | POA: Diagnosis not present

## 2021-07-31 DIAGNOSIS — M5441 Lumbago with sciatica, right side: Secondary | ICD-10-CM | POA: Diagnosis not present

## 2021-07-31 NOTE — Patient Instructions (Addendum)
Good to see you  ?As needed follow up if no change in symptoms follow up 2-3 weeks  ?

## 2021-08-14 ENCOUNTER — Encounter: Payer: Self-pay | Admitting: Sports Medicine

## 2021-08-14 NOTE — Telephone Encounter (Signed)
Pt was messaged and told that he could pick up his exercises at the front desk  ?

## 2021-08-22 ENCOUNTER — Encounter: Payer: Self-pay | Admitting: Sports Medicine

## 2021-08-25 ENCOUNTER — Ambulatory Visit (INDEPENDENT_AMBULATORY_CARE_PROVIDER_SITE_OTHER): Payer: 59 | Admitting: Sports Medicine

## 2021-08-25 ENCOUNTER — Other Ambulatory Visit: Payer: Self-pay

## 2021-08-25 VITALS — BP 136/82 | HR 70 | Ht 74.0 in | Wt 224.0 lb

## 2021-08-25 DIAGNOSIS — M5441 Lumbago with sciatica, right side: Secondary | ICD-10-CM | POA: Diagnosis not present

## 2021-08-25 DIAGNOSIS — R200 Anesthesia of skin: Secondary | ICD-10-CM | POA: Diagnosis not present

## 2021-08-25 DIAGNOSIS — R29898 Other symptoms and signs involving the musculoskeletal system: Secondary | ICD-10-CM

## 2021-08-25 NOTE — Patient Instructions (Addendum)
Good to see you  ?MRI referral lumbar  ?Follow up 3 days after you  MRI to discuss results  ?

## 2021-08-25 NOTE — Progress Notes (Signed)
? ? Benito Mccreedy D.Merril Abbe ?[facility] ?[place] ?Phone: [phone] ?  ?Assessment and Plan:   ?  ?1.  Weakness/numbness of right foot ?2. Acute right-sided low back pain with right-sided sciatica ?-Sub acute, improving, subsequent sports medicine visit ?-Patient has had improvement in low back pain and right-sided radicular symptoms, however he continues to have numbness over plantar surface of right foot as well as decreased strength in plantarflexion ?- Due to decreased strength in plantarflexion, we will proceed with MRI to further evaluate for herniated disc versus nerve impingement ?- May continue Tylenol/NSAIDs as needed for pain control ?- May discontinue gabapentin and Flexeril as patient is not having muscle spasms or nerve pain at this time ? ?Pertinent previous records reviewed include none ?  ?Follow Up: 3 days after MRI to review results and discuss treatment plan ?  ?Subjective:   ?I, Pincus Badder, am serving as a Education administrator for [employer] ? ?Chief Complaint: right foot numbness ? ?HPI:  ?07/31/2021 ?Patient is a 56 year old male complaining of back pain with lower body weakness. Patient states that has been going on for year. Two weeks ago he was stretching and felt a pull and went to chiro ad PCP and the pain started to subside 1.5/10 pain throbbing nerve sciatica pain but since he's been on prednisone his right foot is numb and is making him walk with an altered gait, Monday tried to do toe raises and he wasn't able to move his foot / push off can do double leg heel raise but not able to do single right leg raise , when he's walking he's pulling the foot forward . Isn't able to activate the muscle in his feet to do a heel raise  ?  ?08/25/2021 ?Patient states that the pain has gone away but his foot is still numb  ? ? ?Relevant Historical Information: Lumbar DDD ? ?Additional pertinent review of systems negative. ? ? ?Current  Outpatient Medications:  ?  amLODipine (NORVASC) 10 MG tablet, TAKE 1 TABLET BY MOUTH EVERY DAY, Disp: 90 tablet, Rfl: 3 ?  ascorbic acid (VITAMIN C) 500 MG tablet, Take 500 mg by mouth daily., Disp: , Rfl:  ?  aspirin 81 MG EC tablet, Take 1 tablet (81 mg total) by mouth daily. Swallow whole., Disp: 30 tablet, Rfl: 12 ?  cholecalciferol (VITAMIN D) 25 MCG (1000 UNIT) tablet, Take 1,000 Units by mouth daily., Disp: , Rfl:  ?  cyclobenzaprine (FLEXERIL) 5 MG tablet, Take 1-2 tablets (5-10 mg total) by mouth 3 (three) times daily as needed for muscle spasms., Disp: 30 tablet, Rfl: 0 ?  gabapentin (NEURONTIN) 100 MG capsule, Take 2-3 capsules (200-300 mg total) by mouth at bedtime., Disp: 90 capsule, Rfl: 1 ?  metoprolol succinate (TOPROL-XL) 25 MG 24 hr tablet, Take 1 tablet (25 mg total) by mouth daily., Disp: 90 tablet, Rfl: 3 ?  Multiple Vitamins-Minerals (ZINC PO), Take 1 tablet by mouth daily., Disp: , Rfl:  ?  predniSONE (DELTASONE) 10 MG tablet, Take 4 tabs po qd x 3 days, then 3 tabs po qd x 3 days, then 2 tabs po qd x 3 days, then 1 tab po qd x 3 days, Disp: 30 tablet, Rfl: 0 ?  rosuvastatin (CRESTOR) 20 MG tablet, Take 1 tablet (20 mg total) by mouth daily., Disp: 90 tablet, Rfl: 3 ?  SUMAtriptan (IMITREX) 100 MG tablet, Take 1 tablet (100 mg total) by mouth every 2 (  two) hours as needed for migraine or headache. May repeat in 2 hours if headache persists or recurs., Disp: 10 tablet, Rfl: 11 ?  Turmeric 500 MG CAPS, Take 500 mg by mouth daily., Disp: , Rfl:   ? ?Objective:   ?  ?Vitals:  ? 08/25/21 1522  ?BP: 136/82  ?Pulse: 70  ?SpO2: 96%  ?Weight: 224 lb (101.6 kg)  ?Height: '6\' 2"'$  (1.88 m)  ?  ?  ?Body mass index is 28.76 kg/m?.  ?  ?Physical Exam:   ? ?Gen: Appears well, nad, nontoxic and pleasant ?Psych: Alert and oriented, appropriate mood and affect ?Neuro: Decreased sensation over plantar surface of right foot compared to left, otherwise sensation intact ?4/5 plantarflexion in right foot compared to  5/5 left.  Otherwise, strength is 5/5 in upper and lower extremities, muscle tone wnl ?Skin: no susupicious lesions or rashes ?  ?Back - Normal skin, Spine with normal alignment and no deformity.   ?No tenderness to vertebral process palpation.   ?Paraspinous muscles are not tender and without spasm ?Straight leg raise negative ?Trendelenberg negative ?  ?  ?Right ankle: no deformity, no swelling or effusion ?NTTP over fibular head, lat mal, medial mal, achilles, navicular, base of 5th, ATFL, CFL, deltoid, calcaneous or midfoot ?ROM DF 30, PF 45, inv/ev intact ?Negative ant drawer, talar tilt, rotation test, squeeze test. ?Neg thompson ?No pain with resisted inversion or eversion  ?Palpation of fibular head does not reproduce symptoms ? ? ?Electronically signed by:  ?Benito Mccreedy D.Merril Abbe ?[facility] ?3:44 PM 08/25/21 ?

## 2021-08-30 ENCOUNTER — Ambulatory Visit (INDEPENDENT_AMBULATORY_CARE_PROVIDER_SITE_OTHER): Payer: 59

## 2021-08-30 DIAGNOSIS — R29898 Other symptoms and signs involving the musculoskeletal system: Secondary | ICD-10-CM | POA: Diagnosis not present

## 2021-08-30 DIAGNOSIS — M5441 Lumbago with sciatica, right side: Secondary | ICD-10-CM

## 2021-08-30 DIAGNOSIS — R200 Anesthesia of skin: Secondary | ICD-10-CM | POA: Diagnosis not present

## 2021-09-01 ENCOUNTER — Other Ambulatory Visit: Payer: Self-pay | Admitting: Sports Medicine

## 2021-09-01 DIAGNOSIS — R200 Anesthesia of skin: Secondary | ICD-10-CM

## 2021-09-01 DIAGNOSIS — M5441 Lumbago with sciatica, right side: Secondary | ICD-10-CM

## 2021-09-01 DIAGNOSIS — R29898 Other symptoms and signs involving the musculoskeletal system: Secondary | ICD-10-CM

## 2021-09-01 NOTE — Progress Notes (Signed)
Referral for epidural was sent into [facility] imaging  ?

## 2021-09-05 ENCOUNTER — Ambulatory Visit
Admission: RE | Admit: 2021-09-05 | Discharge: 2021-09-05 | Disposition: A | Discharge status: Home / Self Care | Payer: 59 | Source: Ambulatory Visit | Attending: Sports Medicine | Admitting: Sports Medicine

## 2021-09-05 DIAGNOSIS — R200 Anesthesia of skin: Secondary | ICD-10-CM

## 2021-09-05 DIAGNOSIS — M5441 Lumbago with sciatica, right side: Secondary | ICD-10-CM

## 2021-09-05 DIAGNOSIS — R29898 Other symptoms and signs involving the musculoskeletal system: Secondary | ICD-10-CM

## 2021-09-05 MED ORDER — IOPAMIDOL (ISOVUE-M 200) INJECTION 41%
1.0000 mL | Freq: Once | INTRAMUSCULAR | Status: AC
  Administered 2021-09-05: 1 mL via EPIDURAL

## 2021-09-05 MED ORDER — METHYLPREDNISOLONE ACETATE 40 MG/ML INJ SUSP (RADIOLOG
80.0000 mg | Freq: Once | INTRAMUSCULAR | Status: AC
  Administered 2021-09-05: 80 mg via EPIDURAL

## 2021-09-05 NOTE — Discharge Instructions (Signed)
Post Procedure Spinal Discharge Instruction Sheet ? ?You may resume a regular diet and any medications that you routinely take (including pain medications) unless otherwise noted by MD. ? ?No driving day of procedure. ? ?Light activity throughout the rest of the day.  Do not do any strenuous work, exercise, bending or lifting.  The day following the procedure, you can resume normal physical activity but you should refrain from exercising or physical therapy for at least three days thereafter. ? ?You may apply ice to the injection site, 20 minutes on, 20 minutes off, as needed. Do not apply ice directly to skin.  ? ? ?Common Side Effects: ? ?Headaches- take your usual medications as directed by your physician.  Increase your fluid intake.  Caffeinated beverages may be helpful.  Lie flat in bed until your headache resolves. ? ?Restlessness or inability to sleep- you may have trouble sleeping for the next few days.  Ask your referring physician if you need any medication for sleep. ? ?Facial flushing or redness- should subside within a few days. ? ?Increased pain- a temporary increase in pain a day or two following your procedure is not unusual.  Take your pain medication as prescribed by your referring physician. ? ?Leg cramps ? ?Please contact our office at [phone] for the following symptoms: ?Fever greater than 100 degrees. ?Headaches unresolved with medication after 2-3 days. ?Increased swelling, pain, or redness at injection site. ? ? ?Thank you for visiting [facility] today. ? ?MAY RESUME ASPIRIN AFTER PROCEDURE!   ?

## 2021-09-19 NOTE — Progress Notes (Signed)
? ? Benito Mccreedy D.Merril Abbe ?[facility] ?[place] ?Phone: [phone] ?  ?Assessment and Plan:   ?  ?1. Numbness of right foot ?2. Acute right-sided low back pain with right-sided sciatica ?3. Weakness of right foot ?-Subacute, improving, subsequent visit ?- Improvement in strength of right foot plantarflexion after epidural injection right-sided L5-S1, though still weak compared to left ?- Continue HEP ?- Start physical therapy for weak in plantarflexion, low back ?-Continue Tylenol/NSAIDs as needed for pain control ? ?Pertinent previous records reviewed include lumbar epidural from 09/05/21 ?  ?Follow Up: 4 weeks for reevaluation.  Could consider repeat epidural ?  ?Subjective:   ?I, Pincus Badder, am serving as a Education administrator for [employer] ?  ?Chief Complaint: right foot numbness ?  ?HPI:  ?07/31/2021 ?Patient is a 56 year old male complaining of back pain with lower body weakness. Patient states that has been going on for year. Two weeks ago he was stretching and felt a pull and went to chiro ad PCP and the pain started to subside 1.5/10 pain throbbing nerve sciatica pain but since he's been on prednisone his right foot is numb and is making him walk with an altered gait, Monday tried to do toe raises and he wasn't able to move his foot / push off can do double leg heel raise but not able to do single right leg raise , when he's walking he's pulling the foot forward . Isn't able to activate the muscle in his feet to do a heel raise  ?  ?08/25/2021 ?Patient states that the pain has gone away but his foot is still numb  ? ?09/22/2021 ?Patient states that he is better , is able to get his heel off the floor a little bit , he is able to get some more feeling he is still getting fatigued quickly from walking but he thinks it due to weakness from being shut down for 2 months  ? ?  ?Relevant Historical Information: Lumbar DDD ? ?Additional pertinent review of  systems negative. ? ? ?Current Outpatient Medications:  ?  amLODipine (NORVASC) 10 MG tablet, TAKE 1 TABLET BY MOUTH EVERY DAY, Disp: 90 tablet, Rfl: 3 ?  ascorbic acid (VITAMIN C) 500 MG tablet, Take 500 mg by mouth daily., Disp: , Rfl:  ?  aspirin 81 MG EC tablet, Take 1 tablet (81 mg total) by mouth daily. Swallow whole., Disp: 30 tablet, Rfl: 12 ?  cholecalciferol (VITAMIN D) 25 MCG (1000 UNIT) tablet, Take 1,000 Units by mouth daily., Disp: , Rfl:  ?  cyclobenzaprine (FLEXERIL) 5 MG tablet, Take 1-2 tablets (5-10 mg total) by mouth 3 (three) times daily as needed for muscle spasms., Disp: 30 tablet, Rfl: 0 ?  gabapentin (NEURONTIN) 100 MG capsule, Take 2-3 capsules (200-300 mg total) by mouth at bedtime., Disp: 90 capsule, Rfl: 1 ?  metoprolol succinate (TOPROL-XL) 25 MG 24 hr tablet, Take 1 tablet (25 mg total) by mouth daily., Disp: 90 tablet, Rfl: 3 ?  Multiple Vitamins-Minerals (ZINC PO), Take 1 tablet by mouth daily., Disp: , Rfl:  ?  predniSONE (DELTASONE) 10 MG tablet, Take 4 tabs po qd x 3 days, then 3 tabs po qd x 3 days, then 2 tabs po qd x 3 days, then 1 tab po qd x 3 days, Disp: 30 tablet, Rfl: 0 ?  rosuvastatin (CRESTOR) 20 MG tablet, Take 1 tablet (20 mg total) by mouth daily., Disp: 90 tablet, Rfl: 3 ?  SUMAtriptan (IMITREX) 100 MG tablet, Take 1 tablet (100 mg total) by mouth every 2 (two) hours as needed for migraine or headache. May repeat in 2 hours if headache persists or recurs., Disp: 10 tablet, Rfl: 11 ?  Turmeric 500 MG CAPS, Take 500 mg by mouth daily., Disp: , Rfl:   ? ?Objective:   ?  ?Vitals:  ? 09/22/21 1552  ?BP: 124/80  ?Pulse: 71  ?SpO2: 96%  ?Weight: 227 lb (103 kg)  ?Height: '6\' 2"'$  (1.88 m)  ?  ?  ?Body mass index is 29.15 kg/m?.  ?  ?Physical Exam:   ? ?Gen: Appears well, nad, nontoxic and pleasant ?Psych: Alert and oriented, appropriate mood and affect ?Neuro: Decreased sensation over plantar surface of right foot compared to left, otherwise sensation intact ?4+/5  plantarflexion in right foot compared to 5/5 left.  Otherwise, strength is 5/5 in upper and lower extremities, muscle tone wnl ?Skin: no susupicious lesions or rashes ?  ?Back - Normal skin, Spine with normal alignment and no deformity.   ?No tenderness to vertebral process palpation.   ?Paraspinous muscles are not tender and without spasm ?Straight leg raise negative ?Trendelenberg negative ?  ?  ?Right ankle: no deformity, no swelling or effusion ?NTTP over fibular head, lat mal, medial mal, achilles, navicular, base of 5th, ATFL, CFL, deltoid, calcaneous or midfoot ?ROM DF 30, PF 45, inv/ev intact ?Negative ant drawer, talar tilt, rotation test, squeeze test. ?Neg thompson ?No pain with resisted inversion or eversion  ?Palpation of fibular head does not reproduce symptoms ?  ? ? ?Electronically signed by:  ?Benito Mccreedy D.Merril Abbe ?[facility] ?4:13 PM 09/22/21 ?

## 2021-09-22 ENCOUNTER — Ambulatory Visit (INDEPENDENT_AMBULATORY_CARE_PROVIDER_SITE_OTHER): Payer: 59 | Admitting: Sports Medicine

## 2021-09-22 VITALS — BP 124/80 | HR 71 | Ht 74.0 in | Wt 227.0 lb

## 2021-09-22 DIAGNOSIS — M5441 Lumbago with sciatica, right side: Secondary | ICD-10-CM

## 2021-09-22 DIAGNOSIS — R200 Anesthesia of skin: Secondary | ICD-10-CM | POA: Diagnosis not present

## 2021-09-22 DIAGNOSIS — R29898 Other symptoms and signs involving the musculoskeletal system: Secondary | ICD-10-CM

## 2021-09-22 NOTE — Patient Instructions (Addendum)
Good to see you  ?Pt referral low back and foot drop  ?4 week follow up  ?

## 2021-09-29 ENCOUNTER — Encounter: Payer: Self-pay | Admitting: Physical Therapy

## 2021-09-29 ENCOUNTER — Ambulatory Visit: Payer: 59 | Attending: Sports Medicine | Admitting: Physical Therapy

## 2021-09-29 DIAGNOSIS — M5416 Radiculopathy, lumbar region: Secondary | ICD-10-CM | POA: Insufficient documentation

## 2021-09-29 DIAGNOSIS — R262 Difficulty in walking, not elsewhere classified: Secondary | ICD-10-CM | POA: Diagnosis present

## 2021-09-29 DIAGNOSIS — M6281 Muscle weakness (generalized): Secondary | ICD-10-CM | POA: Diagnosis present

## 2021-09-29 DIAGNOSIS — R29898 Other symptoms and signs involving the musculoskeletal system: Secondary | ICD-10-CM | POA: Diagnosis not present

## 2021-09-29 DIAGNOSIS — R200 Anesthesia of skin: Secondary | ICD-10-CM | POA: Diagnosis not present

## 2021-09-29 DIAGNOSIS — M5441 Lumbago with sciatica, right side: Secondary | ICD-10-CM | POA: Insufficient documentation

## 2021-09-29 NOTE — Patient Instructions (Signed)
Access Code: [email or web address] ?URL: [email or web address] ?Date: 09/29/2021 ?Prepared by: Lum Babe ? ?Exercises ?- Ankle and Toe Plantarflexion with Resistance  - 1 x daily - 7 x weekly - 3 sets - 10 reps - 3 hold ?- Heel Raises with Counter Support  - 1 x daily - 7 x weekly - 3 sets - 10 reps - 3 hold ?- Single Leg Heel Raise with Unilateral Counter Support  - 1 x daily - 7 x weekly - 3 sets - 10 reps - 3 hold ?- Seated Ankle Inversion with Resistance  - 1 x daily - 7 x weekly - 3 sets - 10 reps - 3 hold ?
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
## 2021-10-15 ENCOUNTER — Ambulatory Visit: Payer: 59 | Admitting: Physical Therapy

## 2021-10-28 ENCOUNTER — Encounter: Payer: Self-pay | Admitting: Physical Therapy

## 2021-10-28 ENCOUNTER — Ambulatory Visit: Payer: 59 | Admitting: Physical Therapy

## 2021-10-28 DIAGNOSIS — R262 Difficulty in walking, not elsewhere classified: Secondary | ICD-10-CM

## 2021-10-28 DIAGNOSIS — M5416 Radiculopathy, lumbar region: Secondary | ICD-10-CM

## 2021-10-28 DIAGNOSIS — M6281 Muscle weakness (generalized): Secondary | ICD-10-CM
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
## 2021-12-05 ENCOUNTER — Other Ambulatory Visit: Payer: BC Managed Care – PPO

## 2021-12-10 ENCOUNTER — Ambulatory Visit: Payer: BC Managed Care – PPO | Admitting: Internal Medicine

## 2021-12-11 ENCOUNTER — Other Ambulatory Visit (INDEPENDENT_AMBULATORY_CARE_PROVIDER_SITE_OTHER): Payer: 59

## 2021-12-11 DIAGNOSIS — E559 Vitamin D deficiency, unspecified: Secondary | ICD-10-CM

## 2021-12-11 DIAGNOSIS — E538 Deficiency of other specified B group vitamins: Secondary | ICD-10-CM | POA: Diagnosis not present

## 2021-12-11 DIAGNOSIS — R739 Hyperglycemia, unspecified: Secondary | ICD-10-CM | POA: Diagnosis not present

## 2021-12-11 DIAGNOSIS — Z0001 Encounter for general adult medical examination with abnormal findings: Secondary | ICD-10-CM | POA: Diagnosis not present

## 2021-12-11 DIAGNOSIS — E7800 Pure hypercholesterolemia, unspecified: Secondary | ICD-10-CM

## 2021-12-11 LAB — HEPATIC FUNCTION PANEL
ALT: 31 U/L (ref 0–53)
AST: 20 U/L (ref 0–37)
Albumin: 4.1 g/dL (ref 3.5–5.2)
Alkaline Phosphatase: 59 U/L (ref 39–117)
Bilirubin, Direct: 0.1 mg/dL (ref 0.0–0.3)
Total Bilirubin: 0.9 mg/dL (ref 0.2–1.2)
Total Protein: 7.1 g/dL (ref 6.0–8.3)

## 2021-12-11 LAB — URINALYSIS, ROUTINE W REFLEX MICROSCOPIC
Bilirubin Urine: NEGATIVE
Hgb urine dipstick: NEGATIVE
Ketones, ur: NEGATIVE
Leukocytes,Ua: NEGATIVE
Nitrite: NEGATIVE
RBC / HPF: NONE SEEN (ref 0–?)
Specific Gravity, Urine: 1.025 (ref 1.000–1.030)
Total Protein, Urine: NEGATIVE
Urine Glucose: NEGATIVE
Urobilinogen, UA: 0.2 (ref 0.0–1.0)
pH: 6 (ref 5.0–8.0)

## 2021-12-11 LAB — CBC WITH DIFFERENTIAL/PLATELET
Basophils Absolute: 0.1 10*3/uL (ref 0.0–0.1)
Basophils Relative: 1.5 % (ref 0.0–3.0)
Eosinophils Absolute: 0.2 10*3/uL (ref 0.0–0.7)
Eosinophils Relative: 3.5 % (ref 0.0–5.0)
HCT: 45.1 % (ref 39.0–52.0)
Hemoglobin: 15.2 g/dL (ref 13.0–17.0)
Lymphocytes Relative: 33.5 % (ref 12.0–46.0)
Lymphs Abs: 1.6 10*3/uL (ref 0.7–4.0)
MCHC: 33.7 g/dL (ref 30.0–36.0)
MCV: 91.2 fl (ref 78.0–100.0)
Monocytes Absolute: 0.3 10*3/uL (ref 0.1–1.0)
Monocytes Relative: 6.5 % (ref 3.0–12.0)
Neutro Abs: 2.7 10*3/uL (ref 1.4–7.7)
Neutrophils Relative %: 55 % (ref 43.0–77.0)
Platelets: 160 10*3/uL (ref 150.0–400.0)
RBC: 4.95 Mil/uL (ref 4.22–5.81)
RDW: 12.8 % (ref 11.5–15.5)
WBC: 4.8 10*3/uL (ref 4.0–10.5)

## 2021-12-11 LAB — LIPID PANEL
Cholesterol: 168 mg/dL (ref 0–200)
HDL: 38.5 mg/dL — ABNORMAL LOW (ref >39.00)
NonHDL: 129.82
Total CHOL/HDL Ratio: 4
Triglycerides: 325 mg/dL — ABNORMAL HIGH (ref 0.0–149.0)
VLDL: 65 mg/dL — ABNORMAL HIGH (ref 0.0–40.0)

## 2021-12-11 LAB — BASIC METABOLIC PANEL
BUN: 19 mg/dL (ref 6–23)
CO2: 27 mEq/L (ref 19–32)
Calcium: 8.9 mg/dL (ref 8.4–10.5)
Chloride: 102 mEq/L (ref 96–112)
Creatinine, Ser: 1.1 mg/dL (ref 0.40–1.50)
GFR: 75.52 mL/min (ref >60.00)
Glucose, Bld: 144 mg/dL — ABNORMAL HIGH (ref 70–99)
Potassium: 4.5 mEq/L (ref 3.5–5.1)
Sodium: 137 mEq/L (ref 135–145)

## 2021-12-11 LAB — PSA: PSA: 0.69 ng/mL (ref 0.10–4.00)

## 2021-12-11 LAB — TSH: TSH: 3.47 u[IU]/mL (ref 0.35–5.50)

## 2021-12-11 LAB — HEMOGLOBIN A1C: Hgb A1c MFr Bld: 5.8 % (ref 4.6–6.5)

## 2021-12-11 LAB — LDL CHOLESTEROL, DIRECT: Direct LDL: 90 mg/dL

## 2021-12-11 LAB — VITAMIN B12: Vitamin B-12: 142 pg/mL — ABNORMAL LOW (ref 211–911)

## 2021-12-11 LAB — VITAMIN D 25 HYDROXY (VIT D DEFICIENCY, FRACTURES): VITD: 22.8 ng/mL — ABNORMAL LOW (ref 30.00–100.00)

## 2021-12-15 ENCOUNTER — Encounter: Payer: Self-pay | Admitting: Internal Medicine

## 2021-12-15 ENCOUNTER — Ambulatory Visit (INDEPENDENT_AMBULATORY_CARE_PROVIDER_SITE_OTHER): Payer: 59 | Admitting: Internal Medicine

## 2021-12-15 VITALS — BP 126/70 | HR 48 | Temp 98.0°F | Ht 74.0 in | Wt 221.0 lb

## 2021-12-15 DIAGNOSIS — M5416 Radiculopathy, lumbar region: Secondary | ICD-10-CM

## 2021-12-15 DIAGNOSIS — E559 Vitamin D deficiency, unspecified: Secondary | ICD-10-CM

## 2021-12-15 DIAGNOSIS — E538 Deficiency of other specified B group vitamins: Secondary | ICD-10-CM | POA: Diagnosis not present

## 2021-12-15 DIAGNOSIS — I10 Essential (primary) hypertension: Secondary | ICD-10-CM

## 2021-12-15 DIAGNOSIS — E7800 Pure hypercholesterolemia, unspecified: Secondary | ICD-10-CM | POA: Diagnosis not present

## 2021-12-15 DIAGNOSIS — R001 Bradycardia, unspecified: Secondary | ICD-10-CM

## 2021-12-15 DIAGNOSIS — Z0001 Encounter for general adult medical examination with abnormal findings: Secondary | ICD-10-CM | POA: Diagnosis not present

## 2021-12-15 DIAGNOSIS — R739 Hyperglycemia, unspecified: Secondary | ICD-10-CM

## 2021-12-15 NOTE — Assessment & Plan Note (Signed)
Last vitamin D Lab Results  Component Value Date   VD25OH 22.80 (L) 12/11/2021   Low, to start oral replacement
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
## 2021-12-15 NOTE — Assessment & Plan Note (Signed)
Lab Results  Component Value Date   VITAMINB12 142 (L) 12/11/2021   Low to start oral replacement - b12 1000 mcg qd

## 2021-12-15 NOTE — Patient Instructions (Signed)
Please take OTC Vitamin D3 at 5000 units per day, indefinitely, or 410000 units if you already take the 5000 units.    Also, please take the B12 1000 mcg per day for at least 6 months  Please continue all other medications as before, and refills have been done if requested.  Please have the pharmacy call with any other refills you may need.  Please continue your efforts at being more active, low cholesterol diet, and weight control.  You are otherwise up to date with prevention measures today.  Please keep your appointments with your specialists as you may have planned  You will be contacted regarding the referral for: Neurosurgury  Please make an Appointment to return in 6 months, or sooner if needed

## 2021-12-17 ENCOUNTER — Encounter: Payer: Self-pay | Admitting: Internal Medicine

## 2021-12-17 DIAGNOSIS — M5416 Radiculopathy, lumbar region: Secondary | ICD-10-CM | POA: Insufficient documentation

## 2021-12-17 DIAGNOSIS — R001 Bradycardia, unspecified: Secondary | ICD-10-CM | POA: Insufficient documentation

## 2021-12-17 NOTE — Assessment & Plan Note (Signed)
BP Readings from Last 3 Encounters:  12/15/21 126/70  09/22/21 124/80  09/05/21 137/90   Stable, pt to continue medical treatment toprol xl 25 qd

## 2021-12-17 NOTE — Assessment & Plan Note (Signed)
Pain resolved but weakness persists, recent MRI with disc herniation, for NS referral

## 2021-12-17 NOTE — Assessment & Plan Note (Signed)
Chronic asympt, to avoid any increased toprol xl

## 2021-12-17 NOTE — Assessment & Plan Note (Signed)
Lab Results  Component Value Date   LDLCALC 82 01/21/2018   Stable, pt to continue current statin crestor 20 mg

## 2021-12-17 NOTE — Assessment & Plan Note (Signed)
Age and sex appropriate education and counseling updated with regular exercise and diet Referrals for preventative services - none needed Immunizations addressed - declines covid booster, and shingrix Smoking counseling  - none needed Evidence for depression or other mood disorder - none significant Most recent labs reviewed. I have personally reviewed and have noted: 1) the patient's medical and social history 2) The patient's current medications and supplements 3) The patient's height, weight, and BMI have been recorded in the chart

## 2021-12-17 NOTE — Assessment & Plan Note (Signed)
Lab Results  Component Value Date   HGBA1C 5.8 12/11/2021   Stable, pt to continue current medical treatment  - diet, wt control  

## 2022-01-30 ENCOUNTER — Telehealth: Payer: Self-pay | Admitting: *Deleted

## 2022-01-30 DIAGNOSIS — Z006 Encounter for examination for normal comparison and control in clinical research program: Secondary | ICD-10-CM

## 2022-01-30 NOTE — Telephone Encounter (Signed)
I called patient to let him know about the [employer] 1-Prevention Study. I left message for him to call me.

## 2022-03-13 ENCOUNTER — Other Ambulatory Visit: Payer: Self-pay | Admitting: Internal Medicine

## 2022-03-13 NOTE — Telephone Encounter (Signed)
Please refill as per office routine med refill policy (all routine meds to be refilled for 3 mo or monthly (per pt preference) up to one year from last visit, then month to month grace period for 3 mo, then further med refills will have to be denied) ? ?

## 2022-03-24 ENCOUNTER — Encounter: Payer: Self-pay | Admitting: Gastroenterology

## 2022-05-12 ENCOUNTER — Ambulatory Visit: Payer: BC Managed Care – PPO | Admitting: Internal Medicine

## 2022-06-15 ENCOUNTER — Ambulatory Visit (INDEPENDENT_AMBULATORY_CARE_PROVIDER_SITE_OTHER): Payer: 59 | Admitting: Internal Medicine

## 2022-06-15 ENCOUNTER — Encounter: Payer: Self-pay | Admitting: Internal Medicine

## 2022-06-15 VITALS — BP 122/80 | HR 50 | Temp 98.0°F | Ht 74.0 in | Wt 219.0 lb

## 2022-06-15 DIAGNOSIS — M5416 Radiculopathy, lumbar region: Secondary | ICD-10-CM

## 2022-06-15 DIAGNOSIS — R739 Hyperglycemia, unspecified: Secondary | ICD-10-CM

## 2022-06-15 DIAGNOSIS — Z86010 Personal history of colonic polyps: Secondary | ICD-10-CM | POA: Diagnosis not present

## 2022-06-15 DIAGNOSIS — I10 Essential (primary) hypertension: Secondary | ICD-10-CM
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
## 2022-06-15 NOTE — Patient Instructions (Signed)
Please bring your BP machine to your next visit  Please continue all other medications as before, and refills have been done if requested.  Please have the pharmacy call with any other refills you may need.  Please continue your efforts at being more active, low cholesterol diet, and weight control  Please keep your appointments with your specialists as you may have planned  Please let us know on Mychart when you wish to have the Neurosurgury referral  You will be contacted regarding the referral for: colonoscopy

## 2022-06-16 ENCOUNTER — Encounter: Payer: Self-pay | Admitting: Internal Medicine

## 2022-06-16 DIAGNOSIS — Z86010 Personal history of colonic polyps: Secondary | ICD-10-CM | POA: Insufficient documentation

## 2022-06-16 NOTE — Assessment & Plan Note (Signed)
Also for colonoscopy, as is due

## 2022-06-16 NOTE — Assessment & Plan Note (Addendum)
Lab Results  Component Value Date   HGBA1C 5.8 12/11/2021   Stable, pt to continue current medical treatment c - diet, wt control

## 2022-06-16 NOTE — Assessment & Plan Note (Signed)
BP Readings from Last 3 Encounters:  06/15/22 122/80  12/15/21 126/70  09/22/21 124/80   Stable, pt to continue medical treatment amlodipine 10 mg qd, toprol xl 25 qd

## 2022-06-16 NOTE — Assessment & Plan Note (Signed)
Pt should see NS but will let us know after he clarifies his insurance situation

## 2022-08-10 ENCOUNTER — Other Ambulatory Visit: Payer: Self-pay | Admitting: Internal Medicine

## 2023-01-22 ENCOUNTER — Other Ambulatory Visit: Payer: Self-pay | Admitting: Student in an Organized Health Care Education/Training Program

## 2023-01-22 DIAGNOSIS — M5417 Radiculopathy, lumbosacral region: Secondary | ICD-10-CM

## 2023-02-26 ENCOUNTER — Ambulatory Visit
Admission: RE | Admit: 2023-02-26 | Discharge: 2023-02-26 | Disposition: A | Discharge status: Home / Self Care | Payer: 59 | Source: Ambulatory Visit | Attending: Student in an Organized Health Care Education/Training Program

## 2023-02-26 DIAGNOSIS — M5417 Radiculopathy, lumbosacral region: Secondary | ICD-10-CM

## 2023-11-14 IMAGING — MR MR LUMBAR SPINE W/O CM
4 of 5 series · 27 of 48 positions shown · non-contrast
Comparison: Radiograph from 07/15/2021.

CLINICAL DATA: Initial evaluation for right foot weakness, lower
back pain.

EXAM:
MRI LUMBAR SPINE WITHOUT CONTRAST
TECHNIQUE: Multiplanar, multisequence MR imaging of the lumbar spine was
performed. No intravenous contrast was administered.

[Series 2: T2 · sagittal · 4.0mm · 0.81mm/px · 6 of 15 slices shown (1 of 2)]
[im 1/15]
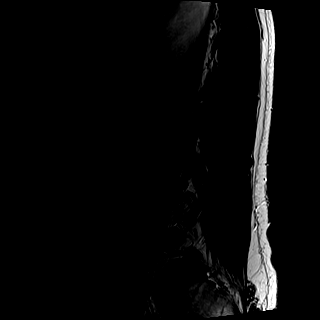
[im 3/15]
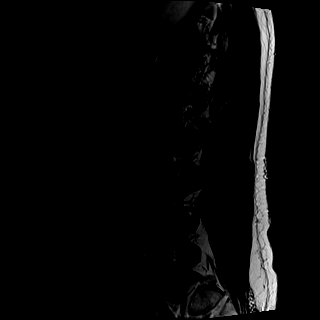
[im 6/15]
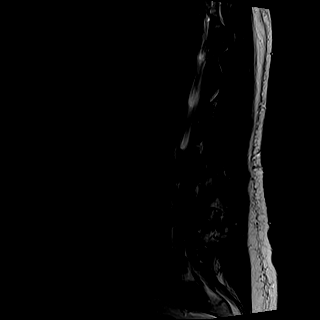
[im 9/15]
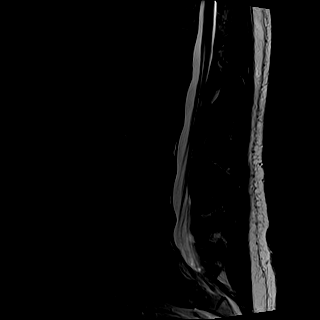
[im 12/15]
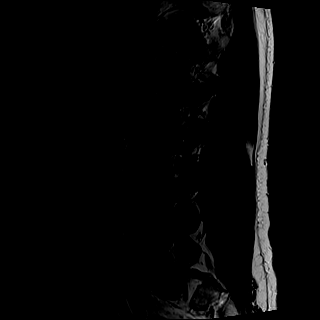
[im 15/15]
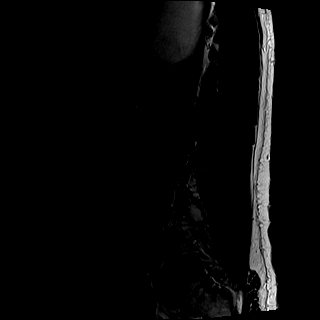

[Series 3: T1 · sagittal · 4.0mm · 0.41mm/px · 5 of 15 slices shown (1 of 2)]
[im 1/15]
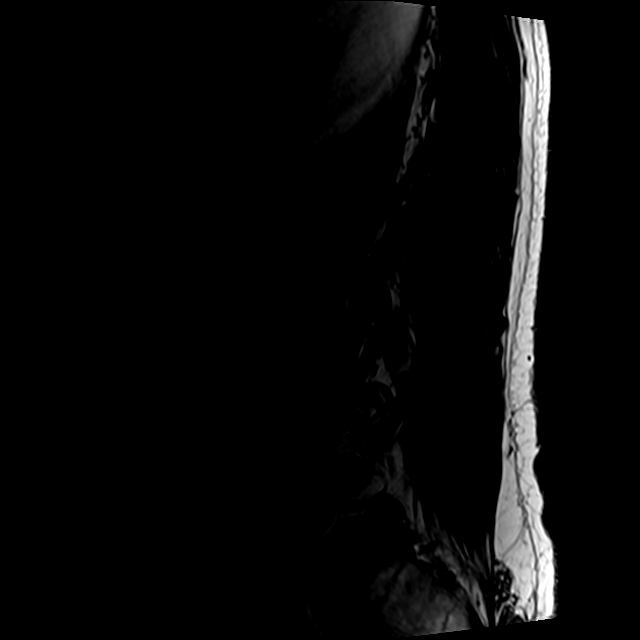
[im 4/15]
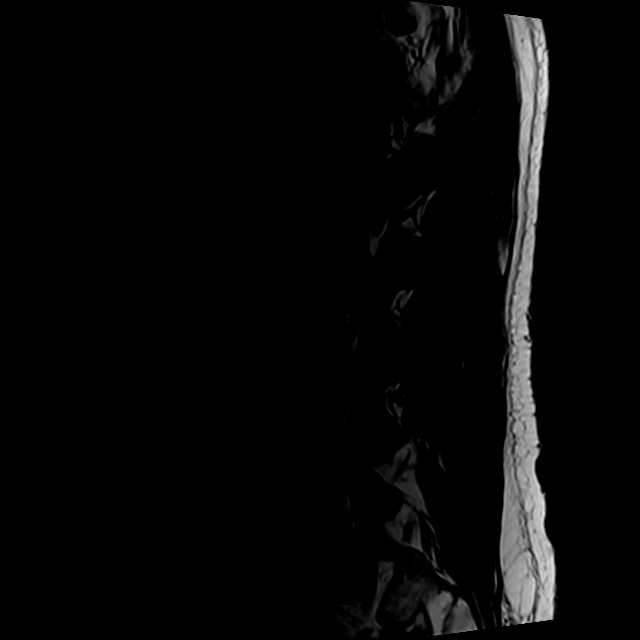
[im 8/15]
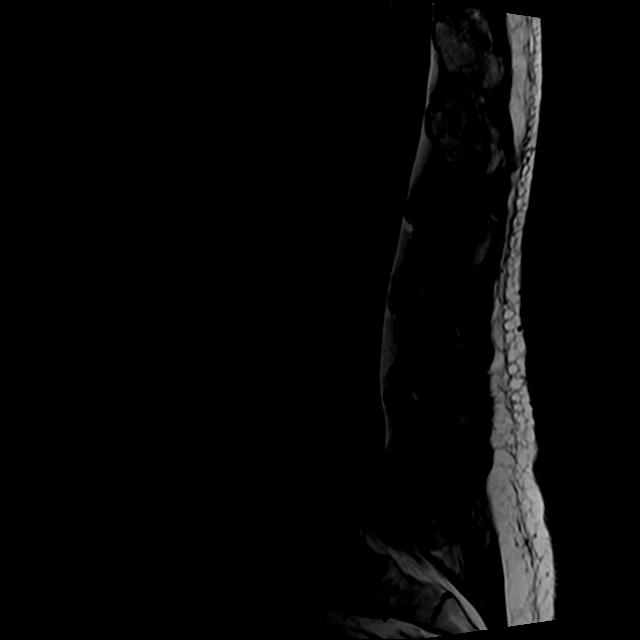
[im 11/15]
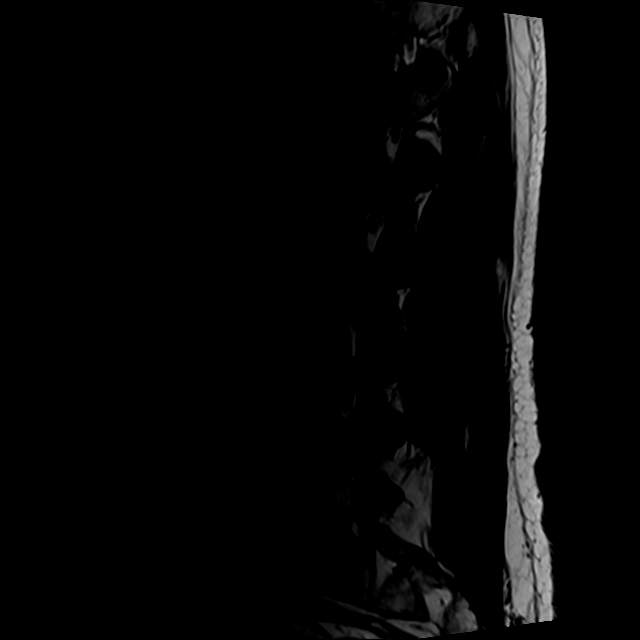
[im 15/15]
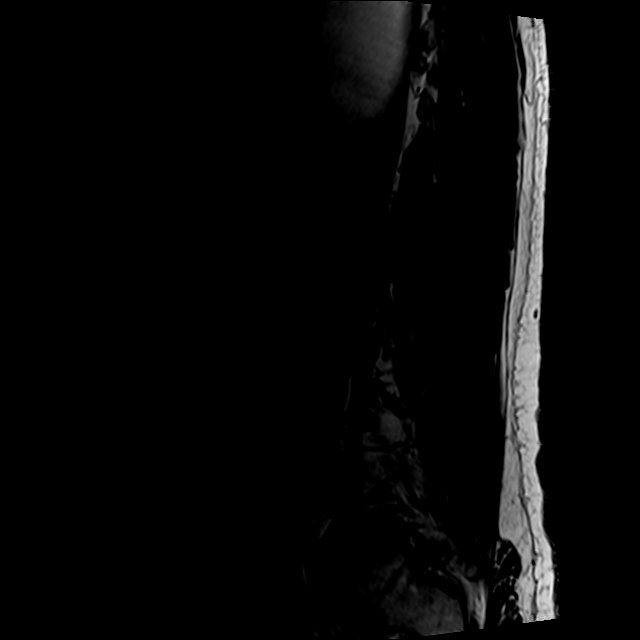

[Series 5: T2 · axial · 4.0mm · 0.78mm/px · z∈[-116,+115]mm · 10 of 44 slices shown (2 of 2)]
[im 3/44]
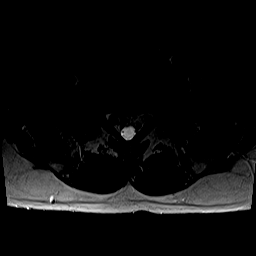
[im 6/44]
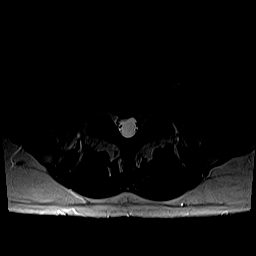
[im 9/44]
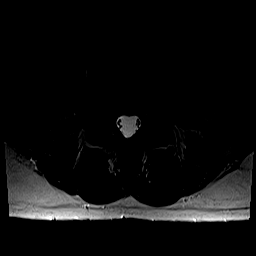
[im 15/44]
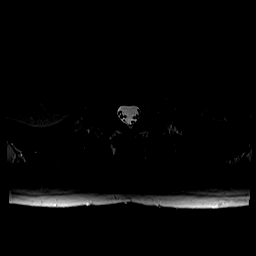
[im 21/44]
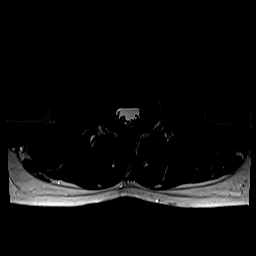
[im 23/44]
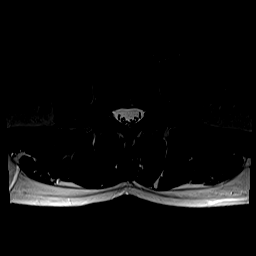
[im 26/44]
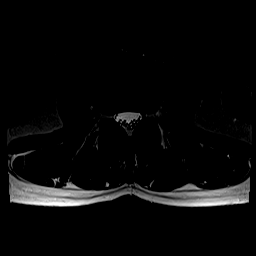
[im 32/44]
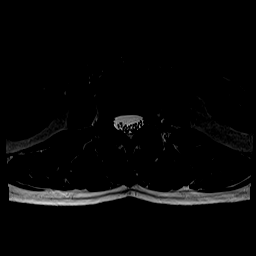
[im 38/44]
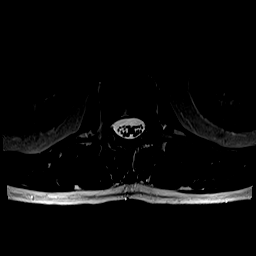
[im 44/44]
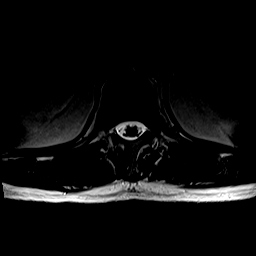

[Series 6: T1 · axial · 4.0mm · 0.39mm/px · z∈[-116,+85]mm · 6 of 44 slices shown (2 of 2)]
[im 3/44]
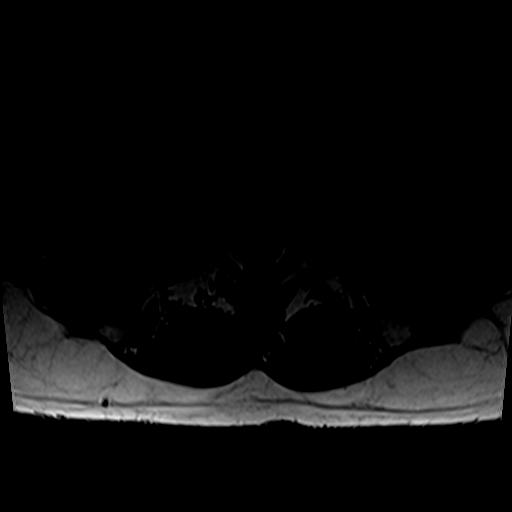
[im 6/44]
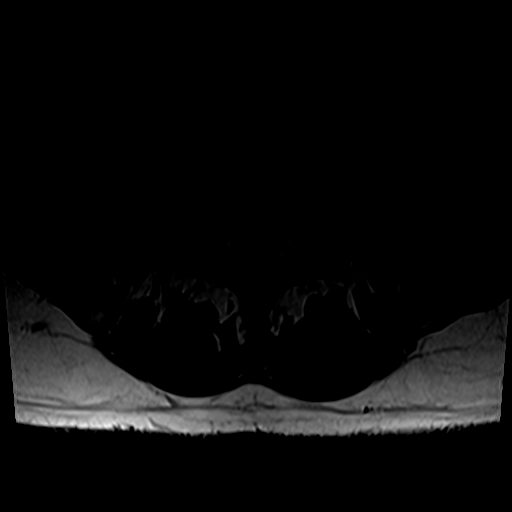
[im 9/44]
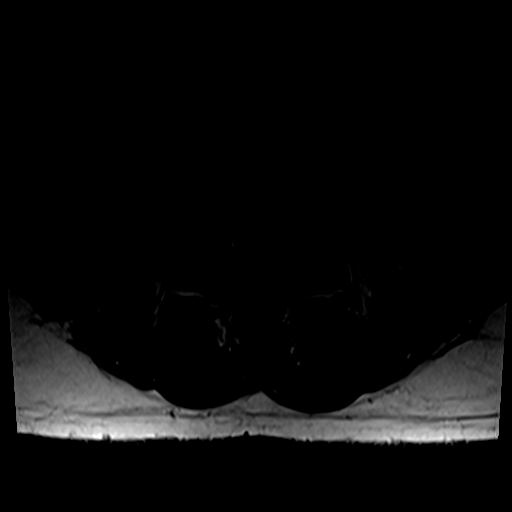
[im 15/44]
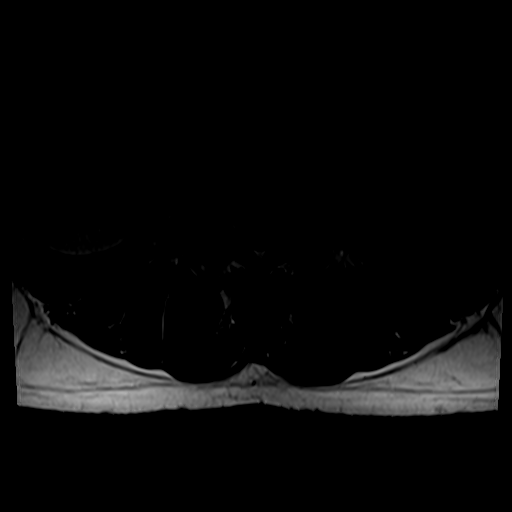
[im 23/44]
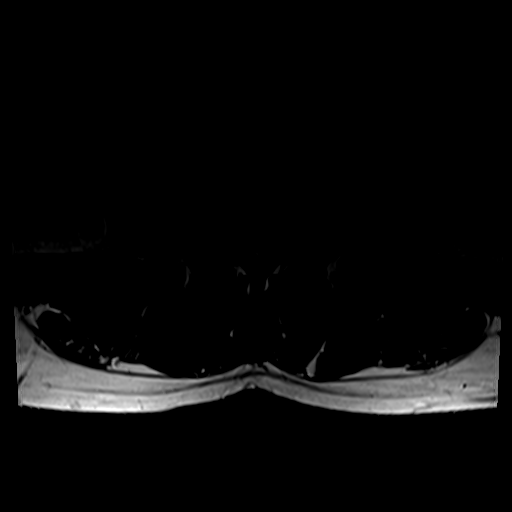
[im 38/44]
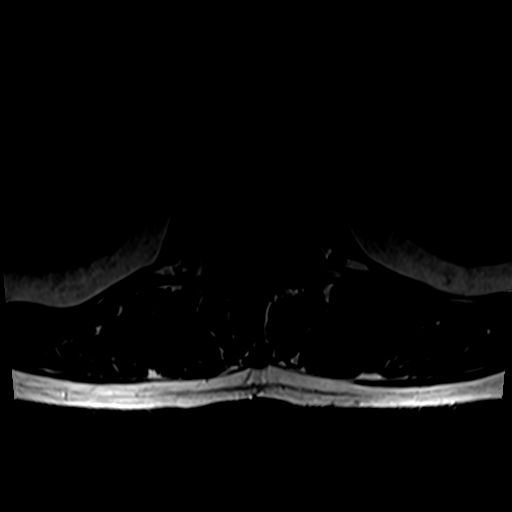

[27 of 48 positions shown; findings below may reference images not displayed]

FINDINGS: Segmentation: Standard. Lowest well-formed disc space labeled the
L5-S1 level.

Alignment: Trace 2 mm retrolisthesis of L4 on L5. Alignment
otherwise normal preservation of the normal lumbar lordosis.

Vertebrae: Vertebral body height maintained without acute or chronic
fracture. Bone marrow signal intensity within normal limits. No
discrete or worrisome osseous lesions or abnormal marrow edema.

Conus medullaris and cauda equina: Conus extends to the L1 level.
Conus and cauda equina appear normal.

Paraspinal and other soft tissues: Unremarkable.

Disc levels:

L1-2:  Unremarkable.

L2-3:  Unremarkable.

L3-4:  Unremarkable.

L4-5: Trace retrolisthesis. Disc desiccation with mild diffuse disc
bulge. Small central annular fissure. Mild bilateral facet
hypertrophy. Resultant mild narrowing of the lateral recesses
bilaterally. Central canal remains patent. Mild left L4 foraminal
narrowing. Right neural foramina remains patent.

L5-S1: Disc desiccation with mild disc bulge. Superimposed right
subarticular disc extrusion with inferior migration contacts and
mildly displaces the descending right S1 nerve root (series 5, image
40). Mild to moderate narrowing of the right lateral recess. Central
canal remains patent. No foraminal stenosis.
IMPRESSION: 1. Right subarticular disc extrusion with inferior migration at
L5-S1, contacting and mildly displacing the descending right S1
nerve root.
2. Mild disc bulging and facet hypertrophy at L4-5 with resultant
mild bilateral lateral recess narrowing, with mild left L4 foraminal
stenosis.

## 2024-04-04 DIAGNOSIS — E6609 Other obesity due to excess calories: Secondary | ICD-10-CM | POA: Diagnosis not present

## 2024-04-04 DIAGNOSIS — Z6830 Body mass index (BMI) 30.0-30.9, adult: Secondary | ICD-10-CM | POA: Diagnosis not present

## 2024-04-11 ENCOUNTER — Ambulatory Visit: Payer: Self-pay | Admitting: Internal Medicine

## 2024-04-11 ENCOUNTER — Ambulatory Visit (INDEPENDENT_AMBULATORY_CARE_PROVIDER_SITE_OTHER): Admitting: Internal Medicine

## 2024-04-11 VITALS — BP 140/82 | HR 63 | Temp 98.0°F | Ht 74.0 in | Wt 225.0 lb

## 2024-04-11 DIAGNOSIS — E559 Vitamin D deficiency, unspecified: Secondary | ICD-10-CM | POA: Diagnosis not present

## 2024-04-11 DIAGNOSIS — E041 Nontoxic single thyroid nodule: Secondary | ICD-10-CM

## 2024-04-11 DIAGNOSIS — Z0000 Encounter for general adult medical examination without abnormal findings: Secondary | ICD-10-CM

## 2024-04-11 DIAGNOSIS — E538 Deficiency of other specified B group vitamins: Secondary | ICD-10-CM

## 2024-04-11 DIAGNOSIS — I10 Essential (primary) hypertension: Secondary | ICD-10-CM

## 2024-04-11 DIAGNOSIS — R739 Hyperglycemia, unspecified: Secondary | ICD-10-CM

## 2024-04-11 DIAGNOSIS — Z0001 Encounter for general adult medical examination with abnormal findings: Secondary | ICD-10-CM

## 2024-04-11 DIAGNOSIS — Z125 Encounter for screening for malignant neoplasm of prostate: Secondary | ICD-10-CM

## 2024-04-11 DIAGNOSIS — E7800 Pure hypercholesterolemia, unspecified: Secondary | ICD-10-CM

## 2024-04-11 LAB — CBC WITH DIFFERENTIAL/PLATELET
Basophils Absolute: 0.1 K/uL (ref 0.0–0.1)
Basophils Relative: 1.2 % (ref 0.0–3.0)
Eosinophils Absolute: 0.2 K/uL (ref 0.0–0.7)
Eosinophils Relative: 2.7 % (ref 0.0–5.0)
HCT: 46.2 % (ref 39.0–52.0)
Hemoglobin: 15.7 g/dL (ref 13.0–17.0)
Lymphocytes Relative: 36 % (ref 12.0–46.0)
Lymphs Abs: 2.1 K/uL (ref 0.7–4.0)
MCHC: 34 g/dL (ref 30.0–36.0)
MCV: 90.8 fl (ref 78.0–100.0)
Monocytes Absolute: 0.4 K/uL (ref 0.1–1.0)
Monocytes Relative: 6.7 % (ref 3.0–12.0)
Neutro Abs: 3.1 K/uL (ref 1.4–7.7)
Neutrophils Relative %: 53.4 % (ref 43.0–77.0)
Platelets: 184 K/uL (ref 150.0–400.0)
RBC: 5.09 Mil/uL (ref 4.22–5.81)
RDW: 12.8 % (ref 11.5–15.5)
WBC: 5.8 K/uL (ref 4.0–10.5)

## 2024-04-11 LAB — URINALYSIS, ROUTINE W REFLEX MICROSCOPIC
Bilirubin Urine: NEGATIVE
Hgb urine dipstick: NEGATIVE
Ketones, ur: NEGATIVE
Leukocytes,Ua: NEGATIVE
Nitrite: NEGATIVE
RBC / HPF: NONE SEEN (ref 0–?)
Specific Gravity, Urine: 1.025 (ref 1.000–1.030)
Total Protein, Urine: NEGATIVE
Urine Glucose: NEGATIVE
Urobilinogen, UA: 0.2 (ref 0.0–1.0)
pH: 6 (ref 5.0–8.0)

## 2024-04-11 LAB — BASIC METABOLIC PANEL WITH GFR
BUN: 12 mg/dL (ref 6–23)
CO2: 29 meq/L (ref 19–32)
Calcium: 9.1 mg/dL (ref 8.4–10.5)
Chloride: 102 meq/L (ref 96–112)
Creatinine, Ser: 0.94 mg/dL (ref 0.40–1.50)
GFR: 89.72 mL/min (ref >60.00)
Glucose, Bld: 91 mg/dL (ref 70–99)
Potassium: 4.3 meq/L (ref 3.5–5.1)
Sodium: 139 meq/L (ref 135–145)

## 2024-04-11 LAB — LIPID PANEL
Cholesterol: 164 mg/dL (ref 0–200)
HDL: 46.9 mg/dL (ref >39.00)
LDL Cholesterol: 77 mg/dL (ref 0–99)
NonHDL: 116.86
Total CHOL/HDL Ratio: 3
Triglycerides: 198 mg/dL — ABNORMAL HIGH (ref 0.0–149.0)
VLDL: 39.6 mg/dL (ref 0.0–40.0)

## 2024-04-11 LAB — TSH: TSH: 2.83 u[IU]/mL (ref 0.35–5.50)

## 2024-04-11 LAB — HEPATIC FUNCTION PANEL
ALT: 44 U/L (ref 0–53)
AST: 27 U/L (ref 0–37)
Albumin: 4.3 g/dL (ref 3.5–5.2)
Alkaline Phosphatase: 69 U/L (ref 39–117)
Bilirubin, Direct: 0.2 mg/dL (ref 0.0–0.3)
Total Bilirubin: 1.5 mg/dL — ABNORMAL HIGH (ref 0.2–1.2)
Total Protein: 7.5 g/dL (ref 6.0–8.3)

## 2024-04-11 LAB — PSA: PSA: 0.87 ng/mL (ref 0.10–4.00)

## 2024-04-11 LAB — HEMOGLOBIN A1C: Hgb A1c MFr Bld: 6 % (ref 4.6–6.5)

## 2024-04-11 MED ORDER — METOPROLOL SUCCINATE ER 25 MG PO TB24: 25.0000 mg | ORAL_TABLET | Freq: Every day | ORAL | 3 refills | Status: AC

## 2024-04-11 MED ORDER — ROSUVASTATIN CALCIUM 20 MG PO TABS: 20.0000 mg | ORAL_TABLET | Freq: Every day | ORAL | 3 refills | Status: AC

## 2024-04-11 MED ORDER — AMLODIPINE BESYLATE 10 MG PO TABS: 10.0000 mg | ORAL_TABLET | Freq: Every day | ORAL | 3 refills | Status: AC
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
## 2024-04-11 NOTE — Patient Instructions (Addendum)
 Please continue all other medications as before, and refills have been done if requested.  Please have the pharmacy call with any other refills you may need.  Please continue your efforts at being more active, low cholesterol diet, and weight control.  You are otherwise up to date with prevention measures today.  Please keep your appointments with your specialists as you may have planned  You will be contacted regarding the referral for: thyroid  u/s  Please go to the LAB at the blood drawing area for the tests to be done  You will be contacted by phone if any changes need to be made immediately.  Otherwise, you will receive a letter about your results with an explanation, but please check with MyChart first  Please make an Appointment to return for your 1 year visit, or sooner if needed

## 2024-04-11 NOTE — Progress Notes (Signed)
 The test results show that your current treatment is OK, as the tests are stable.  Please continue the same plan.  There is no other need for change of treatment or further evaluation based on these results, at this time.  thanks

## 2024-04-13 ENCOUNTER — Encounter: Payer: Self-pay | Admitting: Internal Medicine

## 2024-04-13 DIAGNOSIS — E041 Nontoxic single thyroid nodule: Secondary | ICD-10-CM | POA: Insufficient documentation

## 2024-04-13 NOTE — Assessment & Plan Note (Signed)
 Also for thyroid  u/s r/o nodule

## 2024-04-13 NOTE — Assessment & Plan Note (Signed)
 Age and sex appropriate education and counseling updated with regular exercise and diet Referrals for preventative services - none needed Immunizations addressed - declines all immunizations Smoking counseling  - none needed Evidence for depression or other mood disorder - none significant Most recent labs reviewed. I have personally reviewed and have noted: 1) the patient's medical and social history 2) The patient's current medications and supplements 3) The patient's height, weight, and BMI have been recorded in the chart

## 2024-04-13 NOTE — Assessment & Plan Note (Signed)
 Lab Results  Component Value Date   LDLCALC 77 04/11/2024   Stable, pt to continue current statin crestor  20 mg qd

## 2024-04-13 NOTE — Assessment & Plan Note (Signed)
 BP Readings from Last 3 Encounters:  04/11/24 (!) 140/82  06/15/22 122/80  12/15/21 126/70   Uncontrolled, pt urged for good compliance, pt to continue medical treatment norvasC  10 every day, toprol  xl 25 every day,

## 2024-04-13 NOTE — Assessment & Plan Note (Signed)
 Lab Results  Component Value Date   VITAMINB12 142 (L) 12/11/2021   Low, to start oral replacement - b12 1000 mcg qd

## 2024-04-13 NOTE — Assessment & Plan Note (Signed)
Last vitamin D Lab Results  Component Value Date   VD25OH 22.80 (L) 12/11/2021   Low, to start oral replacement

## 2024-04-13 NOTE — Assessment & Plan Note (Signed)
 Lab Results  Component Value Date   HGBA1C 6.0 04/11/2024   Stable, pt to continue current medical treatment  - diet, wt control

## 2024-04-18 ENCOUNTER — Ambulatory Visit
Admission: RE | Admit: 2024-04-18 | Discharge: 2024-04-18 | Disposition: A | Discharge status: Home / Self Care | Source: Ambulatory Visit | Attending: Internal Medicine | Admitting: Internal Medicine

## 2024-04-18 DIAGNOSIS — E041 Nontoxic single thyroid nodule: Secondary | ICD-10-CM

## 2024-05-02 ENCOUNTER — Encounter: Admitting: Internal Medicine
